# Patient Record
Sex: Female | Born: 1955 | Race: White | Hispanic: No | Marital: Single | Smoking: Former smoker
Health system: Southern US, Community
[De-identification: ages and names within clinical notes are randomized; demographics above are authoritative.]

## PROBLEM LIST (undated history)

## (undated) DIAGNOSIS — K861 Other chronic pancreatitis: Secondary | ICD-10-CM

## (undated) DIAGNOSIS — I10 Essential (primary) hypertension: Secondary | ICD-10-CM

## (undated) DIAGNOSIS — J449 Chronic obstructive pulmonary disease, unspecified: Secondary | ICD-10-CM

## (undated) DIAGNOSIS — I219 Acute myocardial infarction, unspecified: Secondary | ICD-10-CM

## (undated) DIAGNOSIS — I739 Peripheral vascular disease, unspecified: Secondary | ICD-10-CM

## (undated) DIAGNOSIS — I2510 Atherosclerotic heart disease of native coronary artery without angina pectoris: Secondary | ICD-10-CM

## (undated) HISTORY — PX: AMPUTATION TOE: SHX6595

---

## 2017-04-04 DIAGNOSIS — I739 Peripheral vascular disease, unspecified: Secondary | ICD-10-CM | POA: Diagnosis present

## 2020-03-16 DIAGNOSIS — I10 Essential (primary) hypertension: Secondary | ICD-10-CM | POA: Diagnosis present

## 2020-03-16 DIAGNOSIS — Z89422 Acquired absence of other left toe(s): Secondary | ICD-10-CM

## 2020-03-17 DIAGNOSIS — G894 Chronic pain syndrome: Secondary | ICD-10-CM | POA: Diagnosis present

## 2020-08-31 ENCOUNTER — Emergency Department: Payer: Medicare Other

## 2020-08-31 ENCOUNTER — Inpatient Hospital Stay
Admission: EM | Admit: 2020-08-31 | Discharge: 2020-09-03 | DRG: 871 | Disposition: A | Discharge status: Home / Self Care | Payer: Medicare Other | Attending: Internal Medicine | Admitting: Internal Medicine

## 2020-08-31 ENCOUNTER — Encounter: Payer: Self-pay | Admitting: Emergency Medicine

## 2020-08-31 ENCOUNTER — Other Ambulatory Visit: Payer: Self-pay

## 2020-08-31 DIAGNOSIS — D539 Nutritional anemia, unspecified: Secondary | ICD-10-CM | POA: Diagnosis present

## 2020-08-31 DIAGNOSIS — A4189 Other specified sepsis: Principal | ICD-10-CM | POA: Diagnosis present

## 2020-08-31 DIAGNOSIS — I252 Old myocardial infarction: Secondary | ICD-10-CM

## 2020-08-31 DIAGNOSIS — I10 Essential (primary) hypertension: Secondary | ICD-10-CM | POA: Diagnosis present

## 2020-08-31 DIAGNOSIS — J9600 Acute respiratory failure, unspecified whether with hypoxia or hypercapnia: Secondary | ICD-10-CM | POA: Diagnosis present

## 2020-08-31 DIAGNOSIS — E785 Hyperlipidemia, unspecified: Secondary | ICD-10-CM | POA: Diagnosis present

## 2020-08-31 DIAGNOSIS — G894 Chronic pain syndrome: Secondary | ICD-10-CM | POA: Diagnosis present

## 2020-08-31 DIAGNOSIS — Z8249 Family history of ischemic heart disease and other diseases of the circulatory system: Secondary | ICD-10-CM

## 2020-08-31 DIAGNOSIS — Z89432 Acquired absence of left foot: Secondary | ICD-10-CM

## 2020-08-31 DIAGNOSIS — R778 Other specified abnormalities of plasma proteins: Secondary | ICD-10-CM | POA: Diagnosis present

## 2020-08-31 DIAGNOSIS — I429 Cardiomyopathy, unspecified: Secondary | ICD-10-CM

## 2020-08-31 DIAGNOSIS — I110 Hypertensive heart disease with heart failure: Secondary | ICD-10-CM | POA: Diagnosis present

## 2020-08-31 DIAGNOSIS — I214 Non-ST elevation (NSTEMI) myocardial infarction: Secondary | ICD-10-CM

## 2020-08-31 DIAGNOSIS — Z79899 Other long term (current) drug therapy: Secondary | ICD-10-CM

## 2020-08-31 DIAGNOSIS — I739 Peripheral vascular disease, unspecified: Secondary | ICD-10-CM | POA: Diagnosis present

## 2020-08-31 DIAGNOSIS — J9601 Acute respiratory failure with hypoxia: Secondary | ICD-10-CM | POA: Diagnosis not present

## 2020-08-31 DIAGNOSIS — E876 Hypokalemia: Secondary | ICD-10-CM | POA: Diagnosis present

## 2020-08-31 DIAGNOSIS — I5031 Acute diastolic (congestive) heart failure: Secondary | ICD-10-CM | POA: Diagnosis present

## 2020-08-31 DIAGNOSIS — J159 Unspecified bacterial pneumonia: Secondary | ICD-10-CM | POA: Diagnosis present

## 2020-08-31 DIAGNOSIS — Z9861 Coronary angioplasty status: Secondary | ICD-10-CM

## 2020-08-31 DIAGNOSIS — Z832 Family history of diseases of the blood and blood-forming organs and certain disorders involving the immune mechanism: Secondary | ICD-10-CM

## 2020-08-31 DIAGNOSIS — Z89422 Acquired absence of other left toe(s): Secondary | ICD-10-CM

## 2020-08-31 DIAGNOSIS — J440 Chronic obstructive pulmonary disease with acute lower respiratory infection: Secondary | ICD-10-CM | POA: Diagnosis present

## 2020-08-31 DIAGNOSIS — J1282 Pneumonia due to coronavirus disease 2019: Secondary | ICD-10-CM | POA: Diagnosis present

## 2020-08-31 DIAGNOSIS — J441 Chronic obstructive pulmonary disease with (acute) exacerbation: Secondary | ICD-10-CM | POA: Diagnosis present

## 2020-08-31 DIAGNOSIS — K861 Other chronic pancreatitis: Secondary | ICD-10-CM | POA: Diagnosis present

## 2020-08-31 DIAGNOSIS — Z87891 Personal history of nicotine dependence: Secondary | ICD-10-CM

## 2020-08-31 DIAGNOSIS — I2510 Atherosclerotic heart disease of native coronary artery without angina pectoris: Secondary | ICD-10-CM | POA: Diagnosis present

## 2020-08-31 DIAGNOSIS — I255 Ischemic cardiomyopathy: Secondary | ICD-10-CM | POA: Diagnosis present

## 2020-08-31 DIAGNOSIS — U071 COVID-19: Secondary | ICD-10-CM | POA: Diagnosis present

## 2020-08-31 DIAGNOSIS — Z823 Family history of stroke: Secondary | ICD-10-CM

## 2020-08-31 HISTORY — DX: Other chronic pancreatitis: K86.1

## 2020-08-31 HISTORY — DX: Acute myocardial infarction, unspecified: I21.9

## 2020-08-31 HISTORY — DX: Atherosclerotic heart disease of native coronary artery without angina pectoris: I25.10

## 2020-08-31 HISTORY — DX: Chronic obstructive pulmonary disease, unspecified: J44.9

## 2020-08-31 HISTORY — DX: Peripheral vascular disease, unspecified: I73.9

## 2020-08-31 HISTORY — DX: Essential (primary) hypertension: I10

## 2020-08-31 LAB — COMPREHENSIVE METABOLIC PANEL
ALT: 15 U/L (ref 0–44)
AST: 21 U/L (ref 15–41)
Albumin: 3.4 g/dL — ABNORMAL LOW (ref 3.5–5.0)
Alkaline Phosphatase: 101 U/L (ref 38–126)
Anion gap: 13 (ref 5–15)
BUN: 10 mg/dL (ref 8–23)
CO2: 23 mmol/L (ref 22–32)
Calcium: 8.5 mg/dL — ABNORMAL LOW (ref 8.9–10.3)
Chloride: 102 mmol/L (ref 98–111)
Creatinine, Ser: 0.91 mg/dL (ref 0.44–1.00)
GFR calc Af Amer: >60 mL/min (ref >60)
GFR calc non Af Amer: >60 mL/min (ref >60)
Glucose, Bld: 135 mg/dL — ABNORMAL HIGH (ref 70–99)
Potassium: 3.3 mmol/L — ABNORMAL LOW (ref 3.5–5.1)
Sodium: 138 mmol/L (ref 135–145)
Total Bilirubin: 0.6 mg/dL (ref 0.3–1.2)
Total Protein: 7.8 g/dL (ref 6.5–8.1)

## 2020-08-31 LAB — CBC WITH DIFFERENTIAL/PLATELET
Abs Immature Granulocytes: 0.08 10*3/uL — ABNORMAL HIGH (ref 0.00–0.07)
Basophils Absolute: 0 10*3/uL (ref 0.0–0.1)
Basophils Relative: 0 %
Eosinophils Absolute: 0.1 10*3/uL (ref 0.0–0.5)
Eosinophils Relative: 0 %
HCT: 28.6 % — ABNORMAL LOW (ref 36.0–46.0)
Hemoglobin: 9.9 g/dL — ABNORMAL LOW (ref 12.0–15.0)
Immature Granulocytes: 1 %
Lymphocytes Relative: 8 %
Lymphs Abs: 1 10*3/uL (ref 0.7–4.0)
MCH: 39.8 pg — ABNORMAL HIGH (ref 26.0–34.0)
MCHC: 34.6 g/dL (ref 30.0–36.0)
MCV: 114.9 fL — ABNORMAL HIGH (ref 80.0–100.0)
Monocytes Absolute: 0.4 10*3/uL (ref 0.1–1.0)
Monocytes Relative: 3 %
Neutro Abs: 10.2 10*3/uL — ABNORMAL HIGH (ref 1.7–7.7)
Neutrophils Relative %: 88 %
Platelets: 146 10*3/uL — ABNORMAL LOW (ref 150–400)
RBC: 2.49 MIL/uL — ABNORMAL LOW (ref 3.87–5.11)
RDW: 13.9 % (ref 11.5–15.5)
WBC: 11.7 10*3/uL — ABNORMAL HIGH (ref 4.0–10.5)
nRBC: 0 % (ref 0.0–0.2)

## 2020-08-31 LAB — URINALYSIS, COMPLETE (UACMP) WITH MICROSCOPIC
Bilirubin Urine: NEGATIVE
Glucose, UA: NEGATIVE mg/dL
Hgb urine dipstick: NEGATIVE
Ketones, ur: NEGATIVE mg/dL
Leukocytes,Ua: NEGATIVE
Nitrite: NEGATIVE
Protein, ur: NEGATIVE mg/dL
Specific Gravity, Urine: 1.005 (ref 1.005–1.030)
pH: 7 (ref 5.0–8.0)

## 2020-08-31 LAB — TROPONIN I (HIGH SENSITIVITY)
Troponin I (High Sensitivity): 500 ng/L (ref <18)
Troponin I (High Sensitivity): 1063 ng/L (ref <18)

## 2020-08-31 LAB — BRAIN NATRIURETIC PEPTIDE: B Natriuretic Peptide: 1778 pg/mL — ABNORMAL HIGH (ref 0.0–100.0)

## 2020-08-31 LAB — TRIGLYCERIDES: Triglycerides: 152 mg/dL — ABNORMAL HIGH (ref <150)

## 2020-08-31 LAB — LACTIC ACID, PLASMA
Lactic Acid, Venous: 1.5 mmol/L (ref 0.5–1.9)
Lactic Acid, Venous: 1 mmol/L (ref 0.5–1.9)

## 2020-08-31 LAB — PROCALCITONIN: Procalcitonin: 0.93 ng/mL

## 2020-08-31 LAB — LACTATE DEHYDROGENASE: LDH: 200 U/L — ABNORMAL HIGH (ref 98–192)

## 2020-08-31 LAB — FIBRIN DERIVATIVES D-DIMER (ARMC ONLY): Fibrin derivatives D-dimer (ARMC): 3027.51 ng/mL (FEU) — ABNORMAL HIGH (ref 0.00–499.00)

## 2020-08-31 LAB — FERRITIN: Ferritin: 121 ng/mL (ref 11–307)

## 2020-08-31 LAB — FIBRINOGEN: Fibrinogen: >750 mg/dL — ABNORMAL HIGH (ref 210–475)

## 2020-08-31 MED ORDER — METHYLPREDNISOLONE SODIUM SUCC 125 MG IJ SOLR
125.0000 mg | Freq: Once | INTRAMUSCULAR | Status: AC
  Administered 2020-08-31: 125 mg via INTRAVENOUS
  Filled 2020-08-31: qty 2

## 2020-08-31 MED ORDER — ACETAMINOPHEN 500 MG PO TABS
1000.0000 mg | ORAL_TABLET | Freq: Once | ORAL | Status: AC
  Administered 2020-08-31: 1000 mg via ORAL
  Filled 2020-08-31: qty 2

## 2020-08-31 MED ORDER — SODIUM CHLORIDE 0.9 % IV SOLN
200.0000 mg | Freq: Once | INTRAVENOUS | Status: AC
  Administered 2020-09-01: 200 mg via INTRAVENOUS
  Filled 2020-08-31: qty 200

## 2020-08-31 MED ORDER — ONDANSETRON HCL 4 MG/2ML IJ SOLN
4.0000 mg | Freq: Once | INTRAMUSCULAR | Status: AC
  Administered 2020-08-31: 4 mg via INTRAVENOUS
  Filled 2020-08-31: qty 2

## 2020-08-31 MED ORDER — SODIUM CHLORIDE 0.9 % IV SOLN
100.0000 mg | Freq: Every day | INTRAVENOUS | Status: DC
  Administered 2020-09-01: 100 mg via INTRAVENOUS
  Filled 2020-08-31 (×3): qty 20

## 2020-08-31 MED ORDER — ASPIRIN 81 MG PO CHEW
324.0000 mg | CHEWABLE_TABLET | Freq: Once | ORAL | Status: AC
  Administered 2020-08-31: 324 mg via ORAL
  Filled 2020-08-31: qty 4

## 2020-08-31 MED ORDER — MORPHINE SULFATE (PF) 4 MG/ML IV SOLN
4.0000 mg | Freq: Once | INTRAVENOUS | Status: AC
  Administered 2020-08-31: 4 mg via INTRAVENOUS
  Filled 2020-08-31: qty 1

## 2020-08-31 NOTE — ED Provider Notes (Signed)
[facility] Emergency Department Provider Note ____________________________________________   First MD Initiated Contact with Patient 08/31/20 2014     (approximate)  I have reviewed the triage vital signs and the nursing notes.   HISTORY  Chief Complaint Shortness of Breath    HPI Charlotte Castaneda is a 64 y.o. female with PMH as noted below as well as a history of COPD who presents with worsening shortness of breath over the last 2 to 3 days, gradual onset, associated with nonproductive cough, fever, and generalized weakness.  She also has substernal chest pain.  The patient states that she was diagnosed with COVID-19 on 9/5.  She received monoclonal antibody treatment several days ago, but states that she has been feeling worse over these last few days.  Past Medical History:  Diagnosis Date  . MI (myocardial infarction) (HCC)     There are no problems to display for this patient.   Past Surgical History:  Procedure Laterality Date  . AMPUTATION TOE      Prior to Admission medications   Not on File    Allergies Patient has no allergy information on record.  History reviewed. No pertinent family history.  Social History Social History   Tobacco Use  . Smoking status: Former Games developer  . Smokeless tobacco: Never Used  Substance Use Topics  . Alcohol use: Never  . Drug use: Never    Review of Systems  Constitutional: Positive for fever. Eyes: No redness. ENT: No sore throat. Cardiovascular: Positive for chest pain. Respiratory: Positive for shortness of breath. Gastrointestinal: No vomiting or diarrhea.  Genitourinary: Negative for flank pain.  Musculoskeletal: Negative for back pain. Skin: Negative for rash. Neurological: Negative for headache.   ____________________________________________   PHYSICAL EXAM:  VITAL SIGNS: ED Triage Vitals  Enc Vitals Group     BP --      Pulse Rate 08/31/20 2017 (!) 113     Resp 08/31/20  2017 (!) 22     Temp 08/31/20 2017 (!) 102.8 F (39.3 C)     Temp Source 08/31/20 2017 Oral     SpO2 08/31/20 2017 98 %     Weight 08/31/20 2018 85 lb (38.6 kg)     Height 08/31/20 2018 4\' 10"  (1.473 m)     Head Circumference --      Peak Flow --      Pain Score 08/31/20 2018 10     Pain Loc --      Pain Edu? --      Excl. in GC? --     Constitutional: Alert and oriented. Well appearing and in no acute distress. Eyes: Conjunctivae are normal.  Head: Atraumatic. Nose: No congestion/rhinnorhea. Mouth/Throat: Mucous membranes are moist.   Neck: Normal range of motion.  Cardiovascular: Normal rate, regular rhythm. Grossly normal heart sounds.  Good peripheral circulation. Respiratory: Normal respiratory effort.  No retractions. Lungs CTAB. Gastrointestinal: Soft and nontender. No distention.  Genitourinary: No flank tenderness. Musculoskeletal: No lower extremity edema.  Extremities warm and well perfused.  Neurologic:  Normal speech and language. No gross focal neurologic deficits are appreciated.  Skin:  Skin is warm and dry. No rash noted. Psychiatric: Mood and affect are normal. Speech and behavior are normal.  ____________________________________________   LABS (all labs ordered are listed, but only abnormal results are displayed)  Labs Reviewed  CBC WITH DIFFERENTIAL/PLATELET - Abnormal; Notable for the following components:      Result Value   WBC 11.7 (*)  RBC 2.49 (*)    Hemoglobin 9.9 (*)    HCT 28.6 (*)    MCV 114.9 (*)    MCH 39.8 (*)    Platelets 146 (*)    Neutro Abs 10.2 (*)    Abs Immature Granulocytes 0.08 (*)    All other components within normal limits  COMPREHENSIVE METABOLIC PANEL - Abnormal; Notable for the following components:   Potassium 3.3 (*)    Glucose, Bld 135 (*)    Calcium 8.5 (*)    Albumin 3.4 (*)    All other components within normal limits  FIBRIN DERIVATIVES D-DIMER ([facility] ONLY) - Abnormal; Notable for the following components:    Fibrin derivatives D-dimer ([facility]) 3,027.51 (*)    All other components within normal limits  LACTATE DEHYDROGENASE - Abnormal; Notable for the following components:   LDH 200 (*)    All other components within normal limits  TRIGLYCERIDES - Abnormal; Notable for the following components:   Triglycerides 152 (*)    All other components within normal limits  FIBRINOGEN - Abnormal; Notable for the following components:   Fibrinogen >750 (*)    All other components within normal limits  BRAIN NATRIURETIC PEPTIDE - Abnormal; Notable for the following components:   B Natriuretic Peptide 1,778.0 (*)    All other components within normal limits  URINALYSIS, COMPLETE (UACMP) WITH MICROSCOPIC - Abnormal; Notable for the following components:   Color, Urine STRAW (*)    APPearance CLEAR (*)    Bacteria, UA RARE (*)    All other components within normal limits  TROPONIN I (HIGH SENSITIVITY) - Abnormal; Notable for the following components:   Troponin I (High Sensitivity) 500 (*)    All other components within normal limits  CULTURE, BLOOD (ROUTINE X 2)  CULTURE, BLOOD (ROUTINE X 2)  PROCALCITONIN  FERRITIN  LACTIC ACID, PLASMA  C-REACTIVE PROTEIN  LACTIC ACID, PLASMA  TROPONIN I (HIGH SENSITIVITY)   ____________________________________________  EKG  ED ECG REPORT I, Dionne Bucy, the attending physician, personally viewed and interpreted this ECG.  Date: 08/31/2020 EKG Time: 2053 Rate: 99 Rhythm: normal sinus rhythm QRS Axis: normal Intervals: normal ST/T Wave abnormalities: Nonspecific abnormalities Narrative Interpretation: Nonspecific abnormalities with no evidence of acute ischemia  ____________________________________________  RADIOLOGY  CXR: Multifocal interstitial opacities  ____________________________________________   PROCEDURES  Procedure(s) performed: No  Procedures  Critical Care performed: Yes  CRITICAL CARE Performed by: Dionne Bucy   Total critical care time: 40 minutes  Critical care time was exclusive of separately billable procedures and treating other patients.  Critical care was necessary to treat or prevent imminent or life-threatening deterioration.  Critical care was time spent personally by me on the following activities: development of treatment plan with patient and/or surrogate as well as nursing, discussions with consultants, evaluation of patient's response to treatment, examination of patient, obtaining history from patient or surrogate, ordering and performing treatments and interventions, ordering and review of laboratory studies, ordering and review of radiographic studies, pulse oximetry and re-evaluation of patient's condition. ____________________________________________   INITIAL IMPRESSION / ASSESSMENT AND PLAN / ED COURSE  Pertinent labs & imaging results that were available during my care of the patient were reviewed by me and considered in my medical decision making (see chart for details).  64 year old female with a history of COPD presents with worsening shortness of breath, fever, and generalized weakness over the last several days after being diagnosed with COVID-19 on 9/5.  She apparently received a monoclonal antibody treatment  last week.  I attempted to review the past medical records in Epic, however no prior records are available in Epic or Care Everywhere.  The patient reports that she is vaccinated for COVID-19.  On exam, the patient is febrile, tachycardic, tachypneic.  Per EMS, O2 saturation on room air was in the low to mid 80s, and she went up to the high 90s on 6 L by nasal cannula.  She is currently in the mid 90s on 2 L.  She has increased work of breathing but is able to speak in full sentences.  Lung sounds are diminished bilaterally with no significant wheezing or rales.  Neurologic exam is nonfocal.  The rest of the physical exam is unremarkable.  Overall  presentation is most consistent with respiratory failure due to COVID-19, versus possible superimposed pneumonia.  Although the patient is having chest pain, I suspect that this is mostly musculoskeletal and related to her respiratory distress and cough.  I have a low suspicion for ACS, her EKG is nonischemic.  There is no specific clinical evidence to suggest PE.  We will obtain chest x-ray, lab work-up, give analgesia and antipyretic, and reassess.  I anticipate admission given the oxygen requirement.  ----------------------------------------- 11:06 PM on 08/31/2020 -----------------------------------------  Chest x-ray and lab work-up are consistent with active COVID-19.  I have ordered Solu-Medrol and remdesivir.  The troponin is also elevated although there are no focal ST abnormalities.  Presentation is consistent with myocardial strain versus myocarditis.  I have given aspirin.  The patient is stable on 2 L of O2 by nasal cannula at this time.  I discussed the case with Dr. Mikeal Hawthorne from the hospitalist service for admission.  ______________________  Binnie Rail was evaluated in Emergency Department on 08/31/2020 for the symptoms described in the history of present illness. She was evaluated in the context of the global COVID-19 pandemic, which necessitated consideration that the patient might be at risk for infection with the SARS-CoV-2 virus that causes COVID-19. Institutional protocols and algorithms that pertain to the evaluation of patients at risk for COVID-19 are in a state of rapid change based on information released by regulatory bodies including the CDC and federal and state organizations. These policies and algorithms were followed during the patient's care in the ED.   ____________________________________________   FINAL CLINICAL IMPRESSION(S) / ED DIAGNOSES  Final diagnoses:  None      NEW MEDICATIONS STARTED DURING THIS VISIT:  New Prescriptions   No medications on  file     Note:  This document was prepared using Dragon voice recognition software and may include unintentional dictation errors.    Dionne Bucy, MD 08/31/20 [phone]

## 2020-08-31 NOTE — Consult Note (Signed)
Remdesivir - Pharmacy Brief Note    A/P:  Remdesivir 200 mg IVPB once followed by 100 mg IVPB daily x 4 days.   Cephus Shelling, PharmD Clinical Pharmacist  08/31/2020 10:24 PM

## 2020-08-31 NOTE — ED Triage Notes (Signed)
Pt arrival via [facility] from home due to shortness of breath. Patient tested positive for covid on Sept 5th and started feeling extra short of breath today. EMS arrived and found patient on 84% on RA with diminished lower lobes. PT placed on 6 L of oxygen and O2 went up to 100%. Pt has received on infusion.   Pt VS: -104.3 temp -HR 118   Pt received one bolus of fluids with EMS.

## 2020-09-01 ENCOUNTER — Encounter: Payer: Self-pay | Admitting: Internal Medicine

## 2020-09-01 ENCOUNTER — Inpatient Hospital Stay: Payer: Medicare Other

## 2020-09-01 ENCOUNTER — Inpatient Hospital Stay (HOSPITAL_COMMUNITY)
Admit: 2020-09-01 | Discharge: 2020-09-01 | Disposition: A | Discharge status: Home / Self Care | Payer: Medicare Other | Attending: Internal Medicine | Admitting: Internal Medicine

## 2020-09-01 DIAGNOSIS — I739 Peripheral vascular disease, unspecified: Secondary | ICD-10-CM | POA: Diagnosis present

## 2020-09-01 DIAGNOSIS — Z87891 Personal history of nicotine dependence: Secondary | ICD-10-CM | POA: Diagnosis not present

## 2020-09-01 DIAGNOSIS — G894 Chronic pain syndrome: Secondary | ICD-10-CM | POA: Diagnosis present

## 2020-09-01 DIAGNOSIS — E876 Hypokalemia: Secondary | ICD-10-CM | POA: Diagnosis present

## 2020-09-01 DIAGNOSIS — I5031 Acute diastolic (congestive) heart failure: Secondary | ICD-10-CM | POA: Diagnosis present

## 2020-09-01 DIAGNOSIS — E785 Hyperlipidemia, unspecified: Secondary | ICD-10-CM | POA: Diagnosis present

## 2020-09-01 DIAGNOSIS — J1282 Pneumonia due to coronavirus disease 2019: Secondary | ICD-10-CM | POA: Diagnosis present

## 2020-09-01 DIAGNOSIS — Z89422 Acquired absence of other left toe(s): Secondary | ICD-10-CM | POA: Diagnosis not present

## 2020-09-01 DIAGNOSIS — R778 Other specified abnormalities of plasma proteins: Secondary | ICD-10-CM | POA: Diagnosis present

## 2020-09-01 DIAGNOSIS — I110 Hypertensive heart disease with heart failure: Secondary | ICD-10-CM | POA: Diagnosis present

## 2020-09-01 DIAGNOSIS — I420 Dilated cardiomyopathy: Secondary | ICD-10-CM | POA: Diagnosis not present

## 2020-09-01 DIAGNOSIS — J9600 Acute respiratory failure, unspecified whether with hypoxia or hypercapnia: Secondary | ICD-10-CM | POA: Diagnosis present

## 2020-09-01 DIAGNOSIS — U071 COVID-19: Secondary | ICD-10-CM | POA: Diagnosis present

## 2020-09-01 DIAGNOSIS — I5020 Unspecified systolic (congestive) heart failure: Secondary | ICD-10-CM | POA: Diagnosis not present

## 2020-09-01 DIAGNOSIS — A4189 Other specified sepsis: Secondary | ICD-10-CM | POA: Diagnosis present

## 2020-09-01 DIAGNOSIS — Z89432 Acquired absence of left foot: Secondary | ICD-10-CM | POA: Diagnosis not present

## 2020-09-01 DIAGNOSIS — Z832 Family history of diseases of the blood and blood-forming organs and certain disorders involving the immune mechanism: Secondary | ICD-10-CM | POA: Diagnosis not present

## 2020-09-01 DIAGNOSIS — I25118 Atherosclerotic heart disease of native coronary artery with other forms of angina pectoris: Secondary | ICD-10-CM | POA: Diagnosis not present

## 2020-09-01 DIAGNOSIS — J441 Chronic obstructive pulmonary disease with (acute) exacerbation: Secondary | ICD-10-CM | POA: Diagnosis present

## 2020-09-01 DIAGNOSIS — I255 Ischemic cardiomyopathy: Secondary | ICD-10-CM | POA: Diagnosis present

## 2020-09-01 DIAGNOSIS — D539 Nutritional anemia, unspecified: Secondary | ICD-10-CM | POA: Diagnosis present

## 2020-09-01 DIAGNOSIS — J9601 Acute respiratory failure with hypoxia: Secondary | ICD-10-CM | POA: Diagnosis present

## 2020-09-01 DIAGNOSIS — Z8249 Family history of ischemic heart disease and other diseases of the circulatory system: Secondary | ICD-10-CM | POA: Diagnosis not present

## 2020-09-01 DIAGNOSIS — I214 Non-ST elevation (NSTEMI) myocardial infarction: Secondary | ICD-10-CM | POA: Diagnosis not present

## 2020-09-01 DIAGNOSIS — J440 Chronic obstructive pulmonary disease with acute lower respiratory infection: Secondary | ICD-10-CM | POA: Diagnosis present

## 2020-09-01 DIAGNOSIS — J159 Unspecified bacterial pneumonia: Secondary | ICD-10-CM | POA: Diagnosis present

## 2020-09-01 DIAGNOSIS — K861 Other chronic pancreatitis: Secondary | ICD-10-CM | POA: Diagnosis present

## 2020-09-01 DIAGNOSIS — Z823 Family history of stroke: Secondary | ICD-10-CM | POA: Diagnosis not present

## 2020-09-01 DIAGNOSIS — I2510 Atherosclerotic heart disease of native coronary artery without angina pectoris: Secondary | ICD-10-CM | POA: Diagnosis present

## 2020-09-01 DIAGNOSIS — I252 Old myocardial infarction: Secondary | ICD-10-CM | POA: Diagnosis not present

## 2020-09-01 LAB — TROPONIN I (HIGH SENSITIVITY)
Troponin I (High Sensitivity): 2163 ng/L (ref <18)
Troponin I (High Sensitivity): 1915 ng/L (ref <18)

## 2020-09-01 LAB — ECHOCARDIOGRAM COMPLETE
AR max vel: 1.41 cm2
AV Area VTI: 1.38 cm2
AV Area mean vel: 1.54 cm2
AV Mean grad: 3 mmHg
AV Peak grad: 5.7 mmHg
Ao pk vel: 1.19 m/s
Area-P 1/2: 5.79 cm2
Calc EF: 50.4 %
Height: 58 in
S' Lateral: 3.76 cm
Single Plane A2C EF: 47.4 %
Single Plane A4C EF: 53 %
Weight: 1360 oz

## 2020-09-01 LAB — C-REACTIVE PROTEIN: CRP: 14.5 mg/dL — ABNORMAL HIGH (ref <1.0)

## 2020-09-01 LAB — HIV ANTIBODY (ROUTINE TESTING W REFLEX): HIV Screen 4th Generation wRfx: NONREACTIVE

## 2020-09-01 LAB — APTT: aPTT: 58 seconds — ABNORMAL HIGH (ref 24–36)

## 2020-09-01 LAB — SARS CORONAVIRUS 2 BY RT PCR (HOSPITAL ORDER, PERFORMED IN ~~LOC~~ HOSPITAL LAB): SARS Coronavirus 2: NEGATIVE

## 2020-09-01 LAB — PROTIME-INR
INR: 1 (ref 0.8–1.2)
Prothrombin Time: 12.3 seconds (ref 11.4–15.2)

## 2020-09-01 LAB — HEPARIN LEVEL (UNFRACTIONATED): Heparin Unfractionated: <0.1 IU/mL — ABNORMAL LOW (ref 0.30–0.70)

## 2020-09-01 IMAGING — CT CT ANGIO CHEST
2 of 6 series · 17 of 46 positions shown · IV contrast (APPLIED)
Comparison: Chest radiograph [DATE]

CLINICAL DATA: Respiratory failure.  Reported [1X] positive

EXAM:
CT ANGIOGRAPHY CHEST WITH CONTRAST
TECHNIQUE: Multidetector CT imaging of the chest was performed using the
standard protocol during bolus administration of intravenous
contrast. Multiplanar CT image reconstructions and MIPs were
obtained to evaluate the vascular anatomy.
CONTRAST:  100mL OMNIPAQUE IOHEXOL 350 MG/ML SOLN

[Series 5: thins · axial · 0.60mm/px · z∈[-661,-434]mm · 15 of 249 slices shown]
[im 11/249  lung]
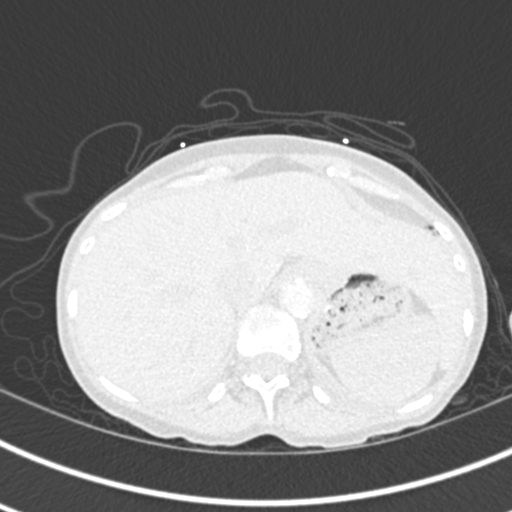
[im 33/249  soft-tissue]
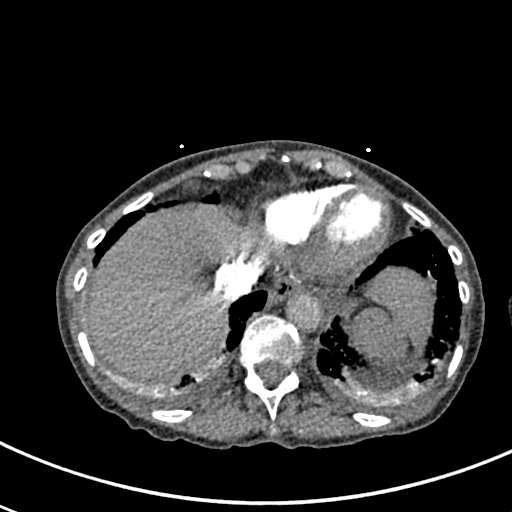
[im 44/249  lung]
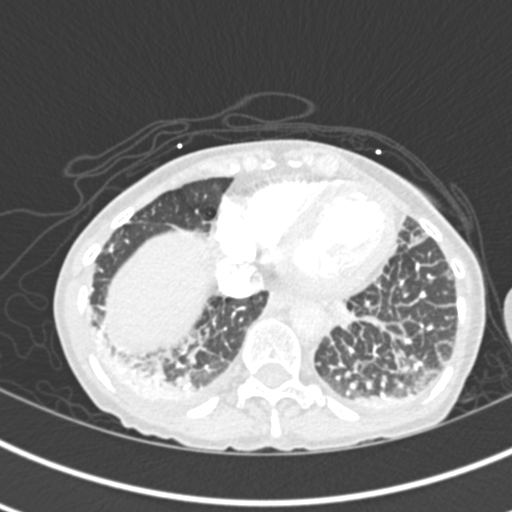
[im 65/249  soft-tissue]
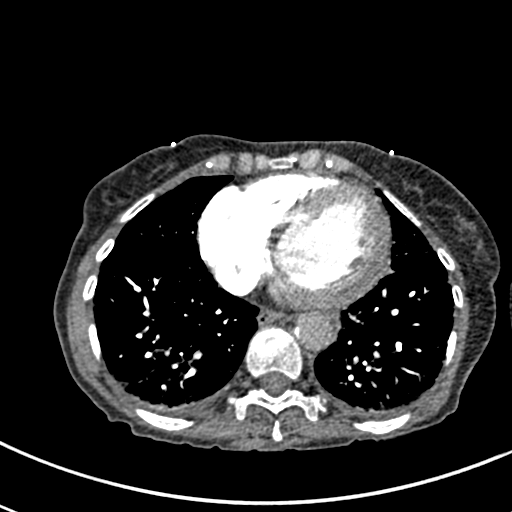
[im 76/249  lung]
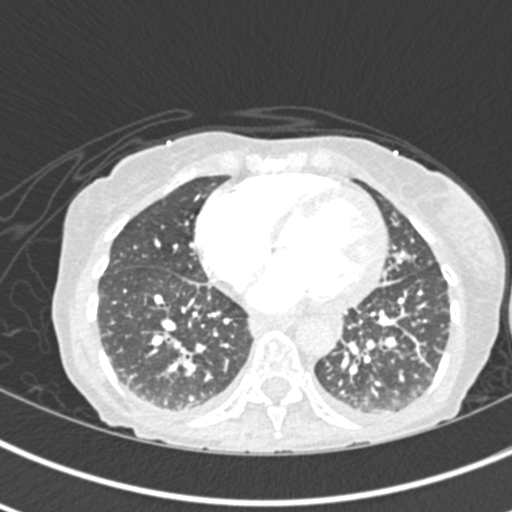
[im 98/249  soft-tissue]
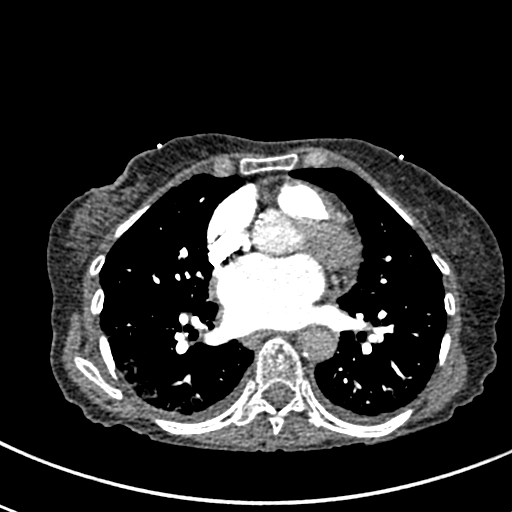
[im 108/249  lung]
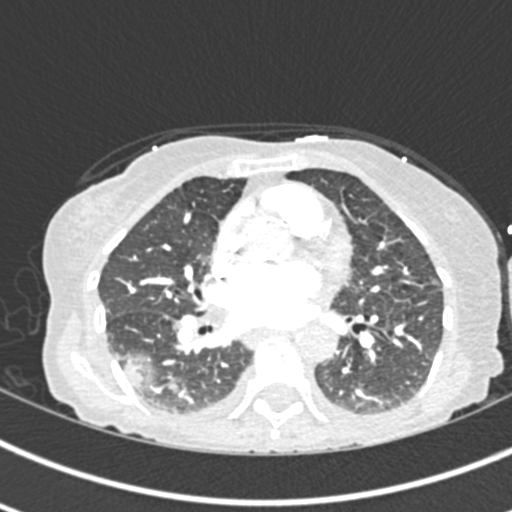
[im 130/249  soft-tissue]
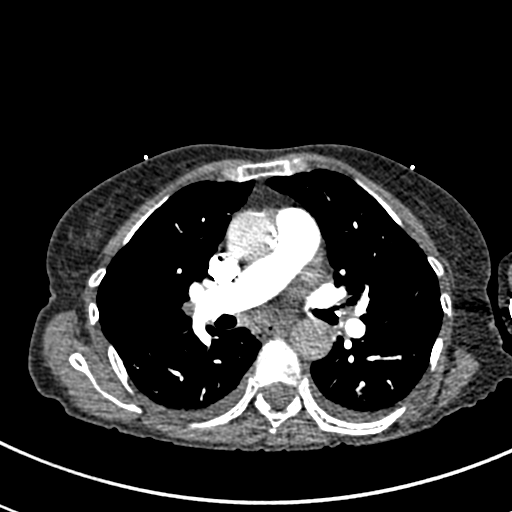
[im 141/249  lung]
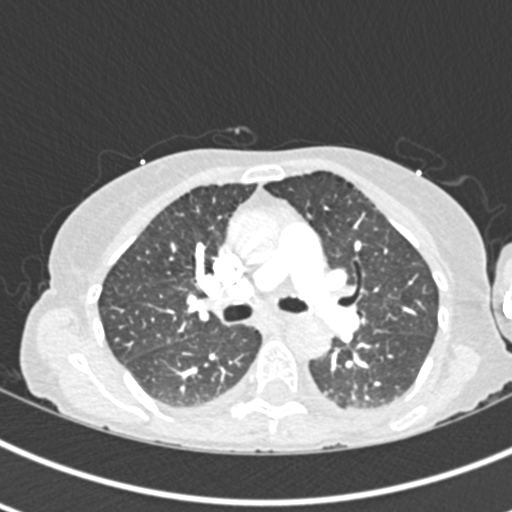
[im 151/249  soft-tissue]
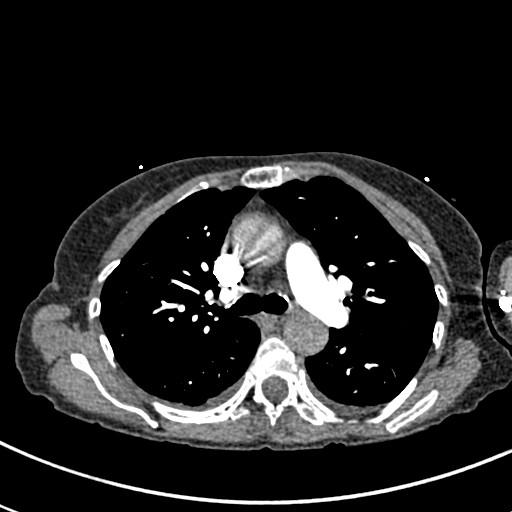
[im 173/249  lung]
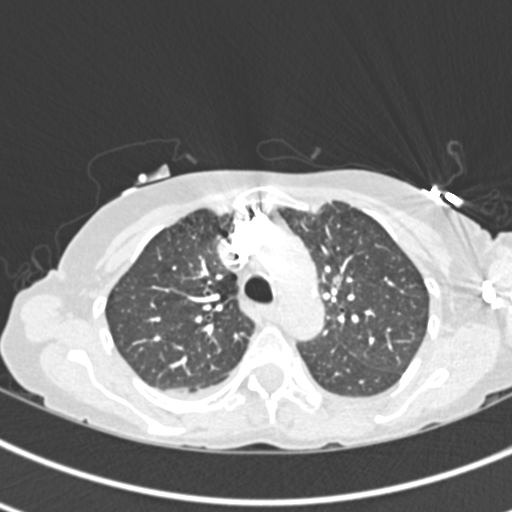
[im 184/249  soft-tissue]
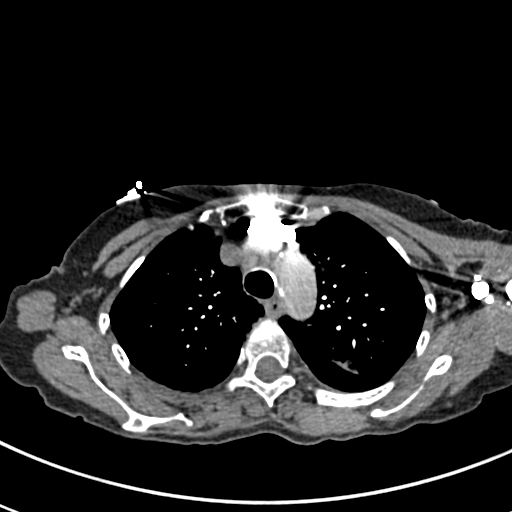
[im 205/249  lung]
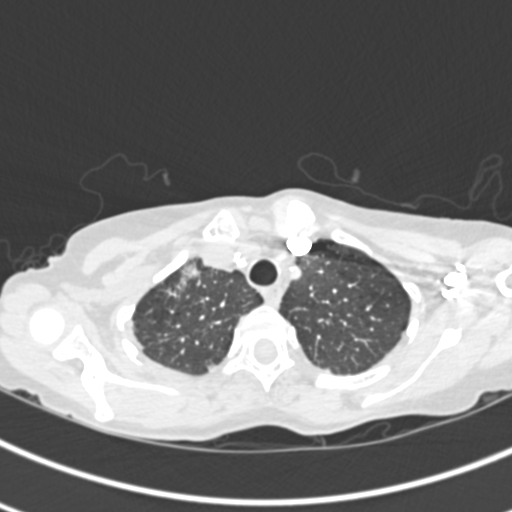
[im 216/249  soft-tissue]
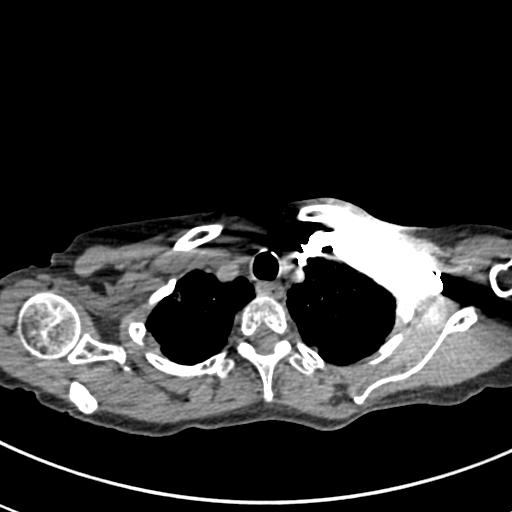
[im 238/249  lung]
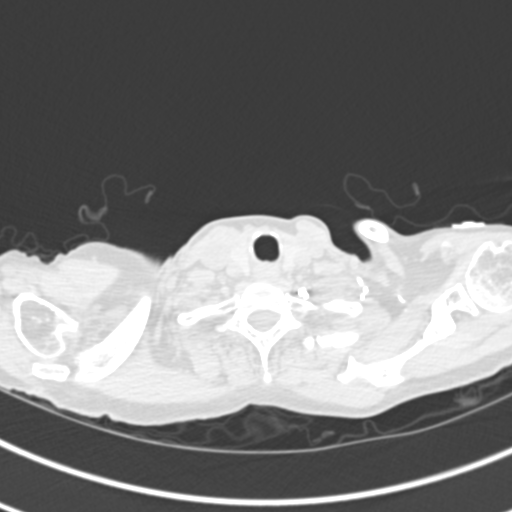

[Series 7: coronal mpr · coronal · 0.49mm/px · 2 of 63 slices shown]
[im 21/63  soft-tissue]
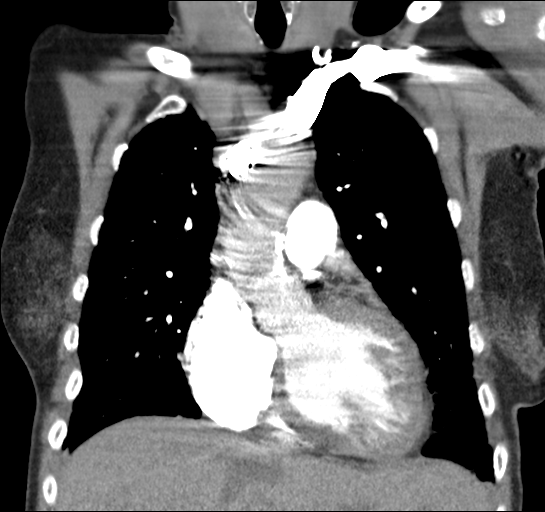
[im 42/63  soft-tissue]
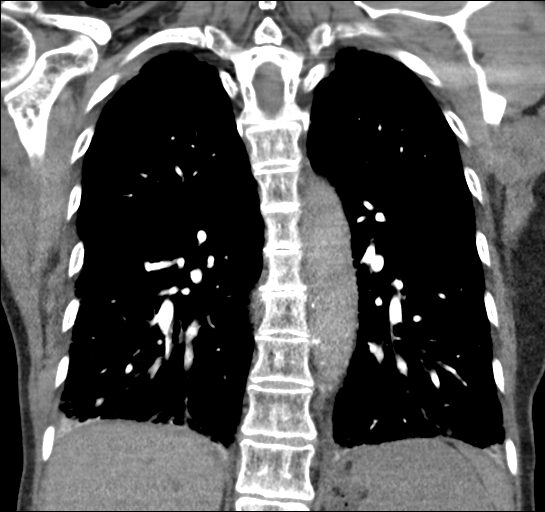

[17 of 46 positions shown; findings below may reference images not displayed]

FINDINGS: Cardiovascular: There is no demonstrable pulmonary embolus. There is
no appreciable thoracic aortic aneurysm. No dissection evident. Note
that the contrast bolus in the aorta is less than optimal for
dissection assessment. There are multiple foci of great vessel
calcification. There is aortic atherosclerosis. There is multifocal
coronary artery calcification. There is no pericardial effusion or
pericardial thickening.

Mediastinum/Nodes: Visualized thyroid appears unremarkable. There is
no appreciable thoracic adenopathy. There are no esophageal lesions.

Lungs/Pleura: There are small pleural effusions bilaterally. There
is compressive atelectasis in each lung base. There is airspace
opacity in the right lower lobe and to a lesser extent scattered in
each upper lobe. Mild consolidation is noted in the lateral segment
right lower lobe in area of infiltrate. There is diffuse lobular
septal thickening throughout the lungs. On axial slice 47 series 6,
there is a nodular opacity in the posterior segment of the left
upper lobe measuring 7 x 4 mm. On axial slice 42 series 6, there is
a 2 mm nodular opacity in the posterior segment of the left upper
lobe.

Upper Abdomen: There is upper abdominal aortic atherosclerosis.
Visualized upper abdominal structures otherwise appear normal.

Musculoskeletal: No blastic or lytic bone lesions. No evident chest
wall lesions.

Review of the MIP images confirms the above findings.
IMPRESSION: 1. No appreciable pulmonary embolus. No thoracic aortic aneurysm.
Foci of aortic atherosclerosis as well as foci of great vessel and
coronary artery calcification noted.

2. Small pleural effusions bilaterally. Apparent compressive
atelectasis in the lower lobes. Focal airspace opacity in the
lateral aspect of the right middle lobe with consolidation in this
area. Patchy airspace opacity is noted in each upper lobe as well.
Suspect atypical organism pneumonia. A degree of bacterial
superinfection in the right lower lobe is questioned.

3. Nodular opacities, largest measuring 7 x 4 mm in the posterior
segment left upper lobe. Non-contrast chest CT at 6-12 months is
recommended. If the nodule is stable at time of repeat CT, then
future CT at 18-24 months (from today's scan) is considered optional
for low-risk patients, but is recommended for high-risk patients.
This recommendation follows the consensus statement: Guidelines for
Management of Incidental Pulmonary Nodules Detected on CT Images:

4. Lobular septal thickening bilaterally which may represent a
degree of interstitial edema. Atypical interstitial pneumonitis
bilaterally could present in this manner. Lymphangitic spread of
tumor from an imaging standpoint is a differential consideration.

5.  No evident adenopathy.

Aortic Atherosclerosis ([1X]-[1X]).

## 2020-09-01 MED ORDER — ACETAMINOPHEN 325 MG PO TABS
650.0000 mg | ORAL_TABLET | Freq: Four times a day (QID) | ORAL | Status: DC | PRN
  Administered 2020-09-03: 650 mg via ORAL
  Filled 2020-09-01: qty 2

## 2020-09-01 MED ORDER — PREDNISONE 50 MG PO TABS: 50.0000 mg | ORAL_TABLET | Freq: Every day | ORAL | Status: DC

## 2020-09-01 MED ORDER — ENOXAPARIN SODIUM 30 MG/0.3ML ~~LOC~~ SOLN: 30.0000 mg | SUBCUTANEOUS | Status: DC

## 2020-09-01 MED ORDER — ASCORBIC ACID 500 MG PO TABS
500.0000 mg | ORAL_TABLET | Freq: Every day | ORAL | Status: DC
  Administered 2020-09-01 – 2020-09-03 (×3): 500 mg via ORAL
  Filled 2020-09-01 (×3): qty 1

## 2020-09-01 MED ORDER — IPRATROPIUM-ALBUTEROL 20-100 MCG/ACT IN AERS
1.0000 | INHALATION_SPRAY | Freq: Four times a day (QID) | RESPIRATORY_TRACT | Status: DC
  Administered 2020-09-01 – 2020-09-03 (×8): 1 via RESPIRATORY_TRACT
  Filled 2020-09-01: qty 4

## 2020-09-01 MED ORDER — HEPARIN (PORCINE) 25000 UT/250ML-% IV SOLN
700.0000 [IU]/h | INTRAVENOUS | Status: DC
  Administered 2020-09-01: 450 [IU]/h via INTRAVENOUS
  Filled 2020-09-01: qty 250

## 2020-09-01 MED ORDER — IOHEXOL 350 MG/ML SOLN
100.0000 mL | Freq: Once | INTRAVENOUS | Status: AC | PRN
  Administered 2020-09-01: 100 mL via INTRAVENOUS

## 2020-09-01 MED ORDER — ATORVASTATIN CALCIUM 20 MG PO TABS: 40.0000 mg | ORAL_TABLET | Freq: Every day | ORAL | Status: DC

## 2020-09-01 MED ORDER — ONDANSETRON HCL 4 MG PO TABS: 4.0000 mg | ORAL_TABLET | Freq: Four times a day (QID) | ORAL | Status: DC | PRN

## 2020-09-01 MED ORDER — ZINC SULFATE 220 (50 ZN) MG PO CAPS
220.0000 mg | ORAL_CAPSULE | Freq: Every day | ORAL | Status: DC
  Administered 2020-09-01 – 2020-09-03 (×3): 220 mg via ORAL
  Filled 2020-09-01 (×3): qty 1

## 2020-09-01 MED ORDER — SODIUM CHLORIDE 0.9 % IV SOLN
500.0000 mg | INTRAVENOUS | Status: DC
  Administered 2020-09-02: 500 mg via INTRAVENOUS
  Filled 2020-09-01 (×2): qty 500

## 2020-09-01 MED ORDER — CARVEDILOL 3.125 MG PO TABS
3.1250 mg | ORAL_TABLET | Freq: Two times a day (BID) | ORAL | Status: DC
  Administered 2020-09-01 – 2020-09-03 (×4): 3.125 mg via ORAL
  Filled 2020-09-01 (×4): qty 1

## 2020-09-01 MED ORDER — HEPARIN BOLUS VIA INFUSION
2000.0000 [IU] | Freq: Once | INTRAVENOUS | Status: AC
  Administered 2020-09-01: 2000 [IU] via INTRAVENOUS
  Filled 2020-09-01: qty 2000

## 2020-09-01 MED ORDER — HYDROCOD POLST-CPM POLST ER 10-8 MG/5ML PO SUER: 5.0000 mL | Freq: Two times a day (BID) | ORAL | Status: DC | PRN

## 2020-09-01 MED ORDER — GABAPENTIN 300 MG PO CAPS
300.0000 mg | ORAL_CAPSULE | Freq: Three times a day (TID) | ORAL | Status: DC
  Administered 2020-09-01 – 2020-09-03 (×7): 300 mg via ORAL
  Filled 2020-09-01 (×7): qty 1

## 2020-09-01 MED ORDER — HEPARIN BOLUS VIA INFUSION
1150.0000 [IU] | Freq: Once | INTRAVENOUS | Status: AC
  Administered 2020-09-01: 1150 [IU] via INTRAVENOUS
  Filled 2020-09-01: qty 1150

## 2020-09-01 MED ORDER — SODIUM CHLORIDE 0.9 % IV SOLN
1.0000 g | INTRAVENOUS | Status: DC
  Administered 2020-09-02: 1 g via INTRAVENOUS
  Filled 2020-09-01: qty 1
  Filled 2020-09-01: qty 10

## 2020-09-01 MED ORDER — METHYLPREDNISOLONE SODIUM SUCC 40 MG IJ SOLR
0.5000 mg/kg | Freq: Two times a day (BID) | INTRAMUSCULAR | Status: DC
  Administered 2020-09-01: 19.2 mg via INTRAVENOUS
  Filled 2020-09-01: qty 1

## 2020-09-01 MED ORDER — SODIUM CHLORIDE 0.9 % IV SOLN: 100.0000 mg | Freq: Every day | INTRAVENOUS | Status: DC

## 2020-09-01 MED ORDER — ONDANSETRON HCL 4 MG/2ML IJ SOLN: 4.0000 mg | Freq: Four times a day (QID) | INTRAMUSCULAR | Status: DC | PRN

## 2020-09-01 MED ORDER — POTASSIUM CHLORIDE CRYS ER 20 MEQ PO TBCR
40.0000 meq | EXTENDED_RELEASE_TABLET | ORAL | Status: AC
  Administered 2020-09-01 (×2): 40 meq via ORAL
  Filled 2020-09-01 (×2): qty 2

## 2020-09-01 MED ORDER — SODIUM CHLORIDE 0.9 % IV SOLN: 200.0000 mg | Freq: Once | INTRAVENOUS | Status: DC

## 2020-09-01 MED ORDER — ALPRAZOLAM 0.25 MG PO TABS
0.2500 mg | ORAL_TABLET | Freq: Every evening | ORAL | Status: DC | PRN
  Administered 2020-09-01 – 2020-09-03 (×2): 0.25 mg via ORAL
  Filled 2020-09-01 (×2): qty 1

## 2020-09-01 MED ORDER — LEVOFLOXACIN IN D5W 750 MG/150ML IV SOLN: 750.0000 mg | INTRAVENOUS | Status: DC

## 2020-09-01 MED ORDER — GUAIFENESIN-DM 100-10 MG/5ML PO SYRP: 10.0000 mL | ORAL_SOLUTION | ORAL | Status: DC | PRN

## 2020-09-01 MED ORDER — ATORVASTATIN CALCIUM 80 MG PO TABS
80.0000 mg | ORAL_TABLET | Freq: Every day | ORAL | Status: DC
  Administered 2020-09-01 – 2020-09-03 (×3): 80 mg via ORAL
  Filled 2020-09-01 (×3): qty 1

## 2020-09-01 MED ORDER — LEVOFLOXACIN IN D5W 750 MG/150ML IV SOLN
750.0000 mg | INTRAVENOUS | Status: DC
  Administered 2020-09-01: 750 mg via INTRAVENOUS
  Filled 2020-09-01: qty 150

## 2020-09-01 NOTE — Progress Notes (Signed)
PHARMACIST - PHYSICIAN COMMUNICATION  CONCERNING:  Enoxaparin (Lovenox) for DVT Prophylaxis    RECOMMENDATION: Patient was prescribed enoxaprin 40mg  q24 hours for VTE prophylaxis.   Filed Weights   08/31/20 2018  Weight: 38.6 kg (85 lb)    Body mass index is 17.77 kg/m.  Estimated Creatinine Clearance: 38.6 mL/min (by C-G formula based on SCr of 0.91 mg/dL).   Patient is candidate for enoxaparin 30mg  every 24 hours based on CrCl <85ml/min or Weight <45kg  DESCRIPTION: Pharmacy has adjusted enoxaparin dose per [facility] policy.  Patient is now receiving enoxaparin _30___mg every _24__ hours    31m, PharmD Clinical Pharmacist  09/01/2020 5:01 AM

## 2020-09-01 NOTE — Progress Notes (Addendum)
Pt refused bed alarm but was educated about safety. Pt reported that have not sleep for two days. Notify Jon Billings. Will continue to monitor.  Update 2125: NP Jon Billings ordered alprazolam 0.25 mg oral at bedtime PRN. Will continue to monitor.

## 2020-09-01 NOTE — Consult Note (Addendum)
Cardiology Consultation:   Patient ID: Charlotte Castaneda; [id number]; Aug 04, 1956   Admit date: 08/31/2020 Date of Consult: 09/01/2020  Primary Care Provider: Patient, No Pcp Per Primary Cardiologist: New to [facility] - consult by End (followed in [place]) Primary Electrophysiologist:  None   Patient Profile:   Charlotte Castaneda is a 64 y.o. female with a hx of CAD status post LAD stenting in 2008 (daughter indicates) vs 2010 (Care Everywhere), extensive PAD history as outlined below, chronic pancreatitis, HTN, HLD, chronic pain syndrome, COPD, and tobacco use quitting approximately 6 weeks ago who was recently diagnosed with COVID-19 approximately 19 days prior who is in the [place] area visiting her daughter and is being seen today for the evaluation of NSTEMI in the setting of presumed COVID-19 PNA at the request of Dr. Nelson Chimes.  History of Present Illness:   Charlotte Castaneda resides in [place] and is followed by cardiology and vascular surgery there.  She is in the [place], [place] area visiting her daughter.  She has history of CAD status post LAD stenting in in either 2008 or 2010 with further details being unclear.  She has an extensive history of PAD status post bilateral kissing common iliac stents, left-sided CIA stenting in 03/2017 for in-stent restenosis, bilateral common iliac artery kissing angioplasty and right mid to distal common iliac artery stenting in 04/2018, left femoral endarterectomy and external iliac artery stenting in 12/2018 for acute on chronic limb ischemia with left foot TMA in 02/2020 despite femoropopliteal bypass.  She had a Covid test at an outside facility with further details unclear.  Multiple family members are positive.  Following this, she received monoclonal antibody infusion on 9/12 though continued to have significant dyspnea.  She is not vaccinated for COVID-19.  In this setting, she presented to [facility] ED where she was hypoxic with oxygen  saturations in the upper 80s percent on room air requiring supplemental oxygen via nasal cannula at 2 L.  Febrile with a T-max of 102.8.  BP stable.  Initial high-sensitivity troponin 500 with a delta of 1063 trending to 2163 currently.  BNP 1778.  D-dimer elevated at 3027.  Other significant labs include potassium 3.3, BUN 10, serum creatinine 0.91, WBC 11.7, Hgb 9.9, PLT 146, blood cultures no growth x2 to date, PCT 0.93.  Chest x-ray showed COPD with multifocal interstitial opacity possibly chronic versus superimposed viral infection.  EKG showed NSR, 99 bpm, nonspecific inferolateral st/t changes. Repeat EKG showed NSR, 68 bpm, LVH, prolonged QT, possible prior inferior infarct, improved st/t changes.  Since her admission, she was received aspirin 324 mg x 1, IV steroids, IV heparin, and remdesivir.  Cardiology is asked to evaluate her elevated troponin.    Past Medical History:  Diagnosis Date  . CAD (coronary artery disease)   . Chronic pancreatitis (HCC)   . COPD (chronic obstructive pulmonary disease) (HCC)   . HTN (hypertension)   . MI (myocardial infarction) (HCC)   . PAD (peripheral artery disease) (HCC)     Past Surgical History:  Procedure Laterality Date  . AMPUTATION TOE       Home Meds: Prior to Admission medications   Medication Sig Start Date End Date Taking? Authorizing Provider  albuterol (VENTOLIN HFA) 108 (90 Base) MCG/ACT inhaler Inhale into the lungs. 08/31/20  Yes [provider]  benzonatate (TESSALON) 100 MG capsule Take 1-2 capsules by mouth 3 (three) times daily as needed. If 1 is not helping you can take 2 at a  time. Do not take more than 6 hours in 24 hours. 08/31/20 09/10/20 Yes [provider]  gabapentin (NEURONTIN) 300 MG capsule Take 300 mg by mouth 3 (three) times daily. 08/28/20  Yes [provider]    Inpatient Medications: Scheduled Meds: . vitamin C  500 mg Oral Daily  . atorvastatin  40 mg Oral Daily  . gabapentin  300 mg  Oral TID  . Ipratropium-Albuterol  1 puff Inhalation Q6H  . methylPREDNISolone (SOLU-MEDROL) injection  0.5 mg/kg Intravenous Q12H   Followed by  . [START ON 09/04/2020] predniSONE  50 mg Oral Daily  . zinc sulfate  220 mg Oral Daily   Continuous Infusions: . [START ON 09/02/2020] azithromycin    . [START ON 09/02/2020] cefTRIAXone (ROCEPHIN)  IV    . heparin 450 Units/hr (09/01/20 1032)  . remdesivir 100 mg in NS 100 mL 100 mg (09/01/20 0934)   PRN Meds: acetaminophen, chlorpheniramine-HYDROcodone, guaiFENesin-dextromethorphan, ondansetron **OR** ondansetron (ZOFRAN) IV  Allergies:  No Known Allergies  Social History:   Social History   Socioeconomic History  . Marital status: Single    Spouse name: Not on file  . Number of children: Not on file  . Years of education: Not on file  . Highest education level: Not on file  Occupational History  . Not on file  Tobacco Use  . Smoking status: Former Games developer  . Smokeless tobacco: Never Used  Substance and Sexual Activity  . Alcohol use: Never  . Drug use: Never  . Sexual activity: Not on file  Other Topics Concern  . Not on file  Social History Narrative  . Not on file   Social Determinants of Health   Financial Resource Strain:   . Difficulty of Paying Living Expenses: Not on file  Food Insecurity:   . Worried About Programme researcher, broadcasting/film/video in the Last Year: Not on file  . Ran Out of Food in the Last Year: Not on file  Transportation Needs:   . Lack of Transportation (Medical): Not on file  . Lack of Transportation (Non-Medical): Not on file  Physical Activity:   . Days of Exercise per Week: Not on file  . Minutes of Exercise per Session: Not on file  Stress:   . Feeling of Stress : Not on file  Social Connections:   . Frequency of Communication with Friends and Family: Not on file  . Frequency of Social Gatherings with Friends and Family: Not on file  . Attends Religious Services: Not on file  . Active Member of Clubs  or Organizations: Not on file  . Attends Banker Meetings: Not on file  . Marital Status: Not on file  Intimate Partner Violence:   . Fear of Current or Ex-Partner: Not on file  . Emotionally Abused: Not on file  . Physically Abused: Not on file  . Sexually Abused: Not on file     Family History:   Family History  Problem Relation Age of Onset  . Cancer Mother   . Heart disease Mother   . Stroke Mother   . Heart attack Mother   . Clotting disorder Mother   . Cancer Father     ROS:  ROS    Not performed in the setting of the COVID-19 pandemic.  Please see MD attestation.  Physical Exam/Data:   Vitals:   09/01/20 0300 09/01/20 0454 09/01/20 0813 09/01/20 1142  BP:  129/66 134/72 (!) 176/84  Pulse: 72 67 71 83  Resp:  18 18 14 18   Temp:  97.7 F (36.5 C) 99.2 F (37.3 C) 97.7 F (36.5 C)  TempSrc:   Oral Oral  SpO2: 90% 95% 100% 100%  Weight:      Height:       No intake or output data in the 24 hours ending 09/01/20 1340 Filed Weights   08/31/20 2018  Weight: 38.6 kg   Body mass index is 17.77 kg/m.   Physical Exam: Not performed in the setting of the COVID-19 pandemic.  Please see MD attestation.  EKG:  The EKG was personally reviewed and demonstrates: NSR, 99 bpm, nonspecific inferolateral st/t changes. Repeat EKG showed NSR, 68 bpm, LVH, prolonged QT, possible prior inferior infarct, improved st/t changes Telemetry:  Telemetry was personally reviewed and demonstrates: SR  Weights: 09/02/20   08/31/20 2018  Weight: 38.6 kg    Relevant CV Studies:  2D echo 09/01/2020: 1. Left ventricular ejection fraction, by estimation, is 45 to 50%. The  left ventricle has mildly decreased function. The left ventricle  demonstrates regional wall motion abnormalities (see scoring  diagram/findings for description). Left ventricular  diastolic parameters are consistent with Grade II diastolic dysfunction  (pseudonormalization). Elevated left atrial  pressure. There is severe  hypokinesis of the left ventricular, basal inferoseptal wall and inferior  wall. There is severe hypokinesis of  the left ventricular, basal anteroseptal wall.  2. Right ventricular systolic function is normal. The right ventricular  size is normal.  3. Left atrial size was mildly dilated.  4. The mitral valve is normal in structure. Mild to moderate mitral valve  regurgitation. No evidence of mitral stenosis.  5. Tricuspid valve regurgitation is mild to moderate.  6. The aortic valve has an indeterminant number of cusps. Aortic valve  regurgitation is mild. No aortic stenosis is present.    Laboratory Data:  Chemistry Recent Labs  Lab 08/31/20 2042  NA 138  K 3.3*  CL 102  CO2 23  GLUCOSE 135*  BUN 10  CREATININE 0.91  CALCIUM 8.5*  GFRNONAA >60  GFRAA >60  ANIONGAP 13    Recent Labs  Lab 08/31/20 2042  PROT 7.8  ALBUMIN 3.4*  AST 21  ALT 15  ALKPHOS 101  BILITOT 0.6   Hematology Recent Labs  Lab 08/31/20 2042  WBC 11.7*  RBC 2.49*  HGB 9.9*  HCT 28.6*  MCV 114.9*  MCH 39.8*  MCHC 34.6  RDW 13.9  PLT 146*   Cardiac EnzymesNo results for input(s): TROPONINI in the last 168 hours. No results for input(s): TROPIPOC in the last 168 hours.  BNP Recent Labs  Lab 08/31/20 2042  BNP 1,778.0*    DDimer No results for input(s): DDIMER in the last 168 hours.  Radiology/Studies:  DG Chest Port 1 View  Result Date: 08/31/2020 IMPRESSION: COPD with multifocal interstitial opacity that may be chronic or indicative of superimposed viral infection. Electronically Signed   By: 09/02/2020 M.D.   On: 08/31/2020 20:35    Assessment and Plan:   1.  CAD involving the native coronary arteries with elevated troponin concerning for NSTEMI versus myocarditis: -Patient has previously been managed in 09/02/2020 state with further testing details unclear and unavailable for review in Care Everywhere -Likely in the setting of the  patient's underlying presumed COVID-19 pneumonia -Continue to cycle troponin until peak -Consider CTA chest given recent travel across the country and COVID-19 status in the setting of elevated D-dimer of note, patient with normal RV systolic function  and ventricular cavity size -ASA -Heparin drip -Echo with EF of 45 to 50% as outlined above with severe  hypokinesis of the left ventricular, basal inferoseptal wall and inferior wall, as well as severe hypokinesis of the left ventricular, basal anteroseptal wall -Initial diagnosis of COVID-19 occurred on 9/9 and she is status post monoclonal antibody therapy, currently being treated with remdesivir and steroids per IM -Ultimately, she will need cardiac cath with timing to be determined given her Covid status  -Obtain lipid panel and A1c for further risk stratification  2.  Acute hypoxic respiratory distress secondary to presumed COVID-19 pneumonia and AECOPD: -Patient previously unvaccinated for COVID-19 -Status post monoclonal antibody therapy as outlined above on 9/12 -Recommend checking COVID test, none available for review -Transitioned back to room air with O2 saturations 100% -D-dimer noted to be elevated in the setting of COVID-19 and recent prolonged travel, consider CTA chest, defer to internal medicine if indicated -Management per internal medicine  3.  Systolic dysfunction:: -Echo with mildly reduced LVSF with wall motion abnormalities as outlined above -Ischemic evaluation pending MD discussion as outlined above -Add low-dose carvedilol 3.125 mg twice daily -Continue to escalate GDMT throughout the admission as possible  4.  PAD: -She has an extensive history of peripheral vascular disease as outlined above -Continue current medical therapy including aspirin, will discuss Plavix with MD  5.  Hyperlipidemia: -Check lipid panel -Titrate Lipitor to 80 mg daily given extensive vascular disease history  6.   Hypokalemia: -Recommend repletion to goal 4.0 -Check magnesium with recommendation to replete to goal 2.0  7.  HTN: -Blood pressure suboptimally controlled -At carvedilol as outlined above -Escalate medical therapy as indicated      For questions or updates, please contact [facility] Please consult [email or web address] for contact info under Cardiology/STEMI.   Signed, Eula Listen, PA-C [facility] Pager: [phone] 09/01/2020, 1:40 PM

## 2020-09-01 NOTE — Progress Notes (Addendum)
PROGRESS NOTE    Charlotte Castaneda  OJJ:[id number] DOB: 18-Nov-1956 DOA: 08/31/2020 PCP: Patient, No Pcp Per   Brief Narrative: Taken from H&P Charlotte Castaneda is a 64 y.o. 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 Apparently patient traveled from [place] to [place] on August 15, 2020.  Earlier in September her daughter's family got sick with Covid.  Patient also developed fever and some shortness of breath.  They resumed it is Covid and she was never tested but did received monoclonal antibodies 2 days ago.  No Covid test done on admission.  She came to ED with chest pain radiating to left arm and associated with some dyspnea and nausea.  She had elevated inflammatory markers.  Procalcitonin elevated.  She was started on remdesivir, steroid and antibiotics for concern of superadded bacterial infection. Patient is vaccinated.  Troponin elevated which continue to rise, more than 2000 today, cardiology was consulted for concern of NSTEMI and she was started on IV heparin infusion.  Subjective: Patient denies any chest pain or shortness of breath when seen today. Stating that she already had stents placed. She wants to communicate with her daughter stating that she forgets things easily.  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 No formal diagnosis of  Covid yet. Patient is unvaccinated.  Although she told me that she is vaccinated but daughter declined that information stating that she is very poor historian and refused vaccine.   She was saturating well on room air this morning. She was requiring 2 L on admission. Initially met sepsis criteria with fever, leukocytosis, tachypnea and tachycardia along with COVID-19 infection. Fever, tachycardia and tachypnea resolved. Elevated procalcitonin and inflammatory markers. She did received one dose of monoclonal antibodies and started on remdesivir and steroid for worsening symptoms. -Do Covid test as it was not done on admission and there is no documentation. -Continue remdesivir and steroid-day 2 -Continue ceftriaxone for 5 days. -Continue Zithromax for 3 days. -Continue supportive care. -Continue to monitor inflammatory markers. -Supplemental oxygen as needed to keep saturation above 90%.  Elevated troponin/NSTEMI/history of CAD. Patient with history of PAD, CAD s/p PCI.  Per daughter patient had a stent placed in her coronaries in 2008, last year she had a stent placed in her lower abdominal aorta and iliac arteries. Per daughter she did had chest pain radiating to left arm before coming to hospital along with some dyspnea and nausea. Troponin continue to rise, currently more than 2000. No acute changes on EKG. no chest pain. Concern of NSTEMI.  Echocardiogram with EF of 40 to 45% along with left wall motion abnormalities. -Consult cardiology-we will appreciate their recommendations. -Start her on heparin infusion.  Hypokalemia.  Potassium at 3.3 on admission. -Check magnesium. -Replete potassium and monitor.  Peripheral vascular disease. S/p amputation of left forefoot. No acute concern.  Per daughter she was on Plavix and Lipitor but she did not solve Lipitor in her meds this  time.  Patient is from [place], no records available.  She was visiting her daughter here. -Continue  Plavix-will ask cardiology if they want to hold before procedure. -Restart Lipitor.  Essential hypertension.  Blood pressure within goal. -Continue to monitor.  Chronic pain syndrome. -Continue home dose of gabapentin.   Objective: Vitals:   09/01/20 0224 09/01/20 0300 09/01/20 0454 09/01/20 0813  BP: (!) 149/99  129/66 134/72  Pulse: 82 72 67 71  Resp: 20 18 18 14  Temp: 97.9 F (36.6 C)  97.7 F (36.5 C) 99.2 F (37.3 C)  TempSrc: Oral   Oral  SpO2: 99% 90% 95% 100%  Weight:      Height:       No intake or output data in the 24 hours ending 09/01/20 0852 Filed Weights   08/31/20 2018  Weight: 38.6 kg    Examination:  General exam: Frail lady, appears calm and comfortable  Respiratory system: Clear to auscultation. Respiratory effort normal. Cardiovascular system: S1 & S2 heard, RRR. No JVD, murmurs, Gastrointestinal system: Soft, nontender, nondistended, bowel sounds positive. Central nervous system: Alert and oriented. No focal neurological deficits. Extremities: No edema, no cyanosis, pulses intact and symmetrical.  Left forefoot amputation. Psychiatry: Judgement and insight appear normal. Mood & affect appropriate.    DVT prophylaxis: Heparin Code Status: Full  Family Communication: Daughter was updated on phone. Disposition Plan:  Status is: Inpatient  Remains inpatient appropriate because:Inpatient level of care appropriate due to severity of illness   Dispo: The patient is from: Home              Anticipated d/c is to: Home              Anticipated d/c date is: 2 days              Patient currently is not medically stable to d/c.  Consultants:   Cardiology  Procedures:  Antimicrobials:  Ceftriaxone Zithromax  Data Reviewed: I have personally reviewed following labs and imaging studies  CBC: Recent Labs  Lab 08/31/20 2042  WBC 11.7*  NEUTROABS 10.2*  HGB 9.9*  HCT 28.6*  MCV 114.9*  PLT 146*   Basic Metabolic Panel: Recent Labs   Lab 08/31/20 2042  NA 138  K 3.3*  CL 102  CO2 23  GLUCOSE 135*  BUN 10  CREATININE 0.91  CALCIUM 8.5*   GFR: Estimated Creatinine Clearance: 38.6 mL/min (by C-G formula based on SCr of 0.91 mg/dL). Liver Function Tests: Recent Labs  Lab 08/31/20 2042  AST 21  ALT 15  ALKPHOS 101  BILITOT 0.6  PROT 7.8  ALBUMIN 3.4*   No results for input(s): LIPASE, AMYLASE in the last 168 hours. No results for input(s): AMMONIA in the last 168 hours. Coagulation Profile: No results for input(s): INR, PROTIME in the last 168 hours. Cardiac Enzymes: No results for input(s): CKTOTAL, CKMB, CKMBINDEX, TROPONINI in the last 168 hours. BNP (last 3 results) No results for input(s): PROBNP in the last 8760 hours. HbA1C: No results for input(s): HGBA1C in the last 72 hours. CBG: No results for input(s): GLUCAP in the last 168 hours. Lipid Profile: Recent Labs    08/31/20 2042  TRIG 152*   Thyroid Function Tests: No results for input(s): TSH, T4TOTAL, FREET4, T3FREE, THYROIDAB in the last 72 hours. Anemia Panel: Recent Labs    08/31/20 2042  FERRITIN 121   Sepsis Labs: Recent Labs  Lab 08/31/20 2042 08/31/20 2228  PROCALCITON 0.93  --     LATICACIDVEN 1.5 1.0    Recent Results (from the past 240 hour(s))  Blood Culture (routine x 2)     Status: None (Preliminary result)   Collection Time: 08/31/20  8:42 PM   Specimen: BLOOD  Result Value Ref Range Status   Specimen Description BLOOD RIGHT HAND  Final   Special Requests   Final    BOTTLES DRAWN AEROBIC AND ANAEROBIC Blood Culture adequate volume   Culture   Final    NO GROWTH < 12 HOURS Performed at [facility], [address], [place], [place] [address]    Report Status PENDING  Incomplete  Blood Culture (routine x 2)     Status: None (Preliminary result)   Collection Time: 08/31/20  8:42 PM   Specimen: BLOOD  Result Value Ref Range Status   Specimen Description BLOOD LEFT AC  Final   Special Requests    Final    BOTTLES DRAWN AEROBIC AND ANAEROBIC Blood Culture adequate volume   Culture   Final    NO GROWTH < 12 HOURS Performed at [facility], [address] [place], [place] [address]    Report Status PENDING  Incomplete     Radiology Studies: DG Chest Port 1 View  Result Date: 08/31/2020 CLINICAL DATA:  COVID-19 EXAM: PORTABLE CHEST 1 VIEW COMPARISON:  None. FINDINGS: Lungs are hyperinflated with diffuse interstitial opacity, greatest at the lung bases. Normal pleural spaces. Normal cardiomediastinal contours. IMPRESSION: COPD with multifocal interstitial opacity that may be chronic or indicative of superimposed viral infection. Electronically Signed   By: Ulyses Jarred M.D.   On: 08/31/2020 20:35    Scheduled Meds: . vitamin C  500 mg Oral Daily  . gabapentin  300 mg Oral TID  . Ipratropium-Albuterol  1 puff Inhalation Q6H  . methylPREDNISolone (SOLU-MEDROL) injection  0.5 mg/kg Intravenous Q12H   Followed by  . [START ON 09/04/2020] predniSONE  50 mg Oral Daily  . zinc sulfate  220 mg Oral Daily   Continuous Infusions: . [START ON 09/02/2020] levofloxacin (LEVAQUIN) IV    . remdesivir 100 mg in NS 100 mL       LOS: 0 days   Time spent: 40 minutes.  Lorella Nimrod, MD [facility]  If 7PM-7AM, please contact night-coverage [email or web address]  09/01/2020, 8:52 AM   This record has been created using Systems analyst. Errors have been sought and corrected,but may not always be located. Such creation errors do not reflect on the standard of care.

## 2020-09-01 NOTE — Progress Notes (Signed)
Notified Dr. Nelson Chimes of the critical lab values related to troponin.  Dr. Nelson Chimes will place new orders.

## 2020-09-01 NOTE — Consult Note (Signed)
ANTICOAGULATION CONSULT NOTE  Pharmacy Consult for Heparin Infusion Indication: chest pain/ACS  Patient Measurements: Height: 4\' 10"  (147.3 cm) Weight: 38.6 kg (85 lb) IBW/kg (Calculated) : 40.9 Heparin Dosing Weight: 38.6 kg  Labs: Recent Labs    08/31/20 2042 08/31/20 2228 09/01/20 0423  HGB 9.9*  --   --   HCT 28.6*  --   --   PLT 146*  --   --   CREATININE 0.91  --   --   TROPONINIHS 500* 1,063* 2,163*    Estimated Creatinine Clearance: 38.6 mL/min (by C-G formula based on SCr of 0.91 mg/dL).  Assessment: Patient is a 64 y/o F with medical history including CAD, PVD who is admitted with COVID-19 pneumonia. EKG with QTc prolongation. Troponin 500 >> 1063 >> 2163. Pharmacy has been consulted to initiate heparin for suspected ACS.   Baseline aPTT, PT-INR pending. CBC with Hgb 9.9, platelets 146 which appears c/w baseline.  Goal of Therapy:  Heparin level 0.3-0.7 units/ml Monitor platelets by anticoagulation protocol: Yes   Plan:  --Heparin 2000 unit IV bolus x 1 followed by continuous infusion at 450 units/hr --HL 6 hours after initiation of infusion --Daily CBC per protocol  2164 09/01/2020,8:49 AM

## 2020-09-01 NOTE — Progress Notes (Signed)
Spoke to patient's daughter Asher Muir. Updated her on patient's status. Asher Muir had just spoken to [facility]. I will contact Asher Muir with updates during my shift.

## 2020-09-01 NOTE — Consult Note (Signed)
ANTICOAGULATION CONSULT NOTE  Pharmacy Consult for Heparin Infusion Indication: chest pain/ACS  Patient Measurements: Height: 4\' 10"  (147.3 cm) Weight: 38.6 kg (85 lb) IBW/kg (Calculated) : 40.9 Heparin Dosing Weight: 38.6 kg  Labs: Recent Labs    08/31/20 2042 08/31/20 2228 09/01/20 0423 09/01/20 0946 09/01/20 1734  HGB 9.9*  --   --   --   --   HCT 28.6*  --   --   --   --   PLT 146*  --   --   --   --   APTT  --   --   --  58*  --   LABPROT  --   --   --  12.3  --   INR  --   --   --  1.0  --   HEPARINUNFRC  --   --   --   --  <0.10*  CREATININE 0.91  --   --   --   --   TROPONINIHS 500* 1,063* 2,163*  --   --     Estimated Creatinine Clearance: 38.6 mL/min (by C-G formula based on SCr of 0.91 mg/dL).  Assessment: Patient is a 64 y/o F with medical history including CAD, PVD who is admitted with COVID-19 pneumonia. EKG with QTc prolongation. Troponin 500 >> 1063 >> 2163. Pharmacy has been consulted to initiate heparin for suspected ACS.   Baseline aPTT, PT-INR pending. CBC with Hgb 9.9, platelets 146 which appears c/w baseline.  9/14 @ 1809 HL < 0.10. Subtherapeutic.   Goal of Therapy:  Heparin level 0.3-0.7 units/ml Monitor platelets by anticoagulation protocol: Yes   Plan:  -- Spoke with nurse 10/14) and was told that the patient went for a CT scan earlier today and the heparin infusion was running when the patient left the floor and returned. She confirmed no heparin interruptions.   --Heparin 1150 unit IV bolus x 1 followed by continuous infusion rate increase at 550 units/hr. D/w changes with nursing who was in the room when I called.   --HL 6 hours after initiation of infusion  --Daily CBC per protocol  Sheria Lang 09/01/2020,6:11 PM

## 2020-09-01 NOTE — Progress Notes (Signed)
Pt transferred to 2A in stable condition with Heparin drip infusing.

## 2020-09-01 NOTE — H&P (Signed)
History and Physical   Charlotte Castaneda ZOX:[id number] DOB: May 11, 1956 DOA: 08/31/2020  Referring MD/NP/PA: Dr. Marisa Severin  PCP: Patient, No Pcp Per   Outpatient Specialists: None  Patient coming from: Home  Chief Complaint: Shortness of breath and cough  HPI: Charlotte Castaneda is a 64 y.o. female with medical history significant of coronary artery disease, chronic pain syndrome, essential hypertension, peripheral vascular disease status post left forefoot amputation, previous tobacco abuse quit about 6 weeks ago who apparently was diagnosed with COVID-19 infection according to patient about 19 days ago.  She was noted to have had the test done on September 9 which was 4 days ago at [facility].  Patient came in with worsening shortness of breath.  Apparently she had monoclonal antibody infusion 2 days ago but continued to get worse with significant shortness of breath cough and wheezing.  Patient came to the ER where she was seen and evaluated.  She is hypoxic requiring at least 2 L of oxygen.  She appears to be very anxious and worried.  Her oxygen sats are in the 80s on room air.  She quit smoking about 6 weeks ago and was never diagnosed with COPD but patient appears to be having some wheezing in addition to her respiratory failure.  Patient is now requiring 2 L of oxygen.  She is being admitted with COVID-19 pneumonia with hypoxia..  ED Course: Temperature 102.8, blood pressure 160/70 pulse 113 was 124 oxygen sat 89% room air.  White count 11.7 hemoglobin 9.9 and platelets 146 potassium 3.3 calcium 8.5 and glucose 135.  Otherwise chemistry appears to be within normal.  Troponin initially 500 now 1063.  Glucose is 152 ferritin 121 lactic acid 1.5 and procalcitonin 0.90.  BNP of 12/25/1976.  LDH is 200.  Patient being admitted with acute hypoxic respiratory failure secondary to COVID-19 infection with possible secondary pneumonia infection and elevated troponin.  Probably myocardial injury from  Covid.  Review of Systems: As per HPI otherwise 10 point review of systems negative.    Past Medical History:  Diagnosis Date  . MI (myocardial infarction) [facility])     Past Surgical History:  Procedure Laterality Date  . AMPUTATION TOE       reports that she has quit smoking. She has never used smokeless tobacco. She reports that she does not drink alcohol and does not use drugs.  Not on File  History reviewed. No pertinent family history.   Prior to Admission medications   Not on File    Physical Exam: Vitals:   08/31/20 2230 08/31/20 2300 08/31/20 2330 08/31/20 2344  BP: (!) 162/70 138/72 117/84   Pulse: 85 84 86 81  Resp: 19 20 19 18   Temp:      TempSrc:      SpO2: 96% 93% (!) 89% 96%  Weight:      Height:          Constitutional: Anxious, acutely ill looking in no distress Vitals:   08/31/20 2230 08/31/20 2300 08/31/20 2330 08/31/20 2344  BP: (!) 162/70 138/72 117/84   Pulse: 85 84 86 81  Resp: 19 20 19 18   Temp:      TempSrc:      SpO2: 96% 93% (!) 89% 96%  Weight:      Height:       Eyes: PERRL, lids and conjunctivae normal ENMT: Mucous membranes are moist. Posterior pharynx clear of any exudate or lesions.Normal dentition.  Neck: normal, supple, no masses, no thyromegaly Respiratory: Decreased  air entry bilaterally with expiratory wheezing with some rhonchi normal respiratory effort. No accessory muscle use.  Cardiovascular: Sinus tachycardia, no murmurs / rubs / gallops. No extremity edema. 2+ pedal pulses. No carotid bruits.  Abdomen: no tenderness, no masses palpated. No hepatosplenomegaly. Bowel sounds positive.  Musculoskeletal: no clubbing / cyanosis. No joint deformity upper and lower extremities. Good ROM, no contractures. Normal muscle tone.  Skin: no rashes, lesions, ulcers. No induration Neurologic: CN 2-12 grossly intact. Sensation intact, DTR normal. Strength 5/5 in all 4.  Psychiatric: Normal judgment and insight. Alert and oriented x  3.  Anxious mood.     Labs on Admission: I have personally reviewed following labs and imaging studies  CBC: Recent Labs  Lab 08/31/20 2042  WBC 11.7*  NEUTROABS 10.2*  HGB 9.9*  HCT 28.6*  MCV 114.9*  PLT 146*   Basic Metabolic Panel: Recent Labs  Lab 08/31/20 2042  NA 138  K 3.3*  CL 102  CO2 23  GLUCOSE 135*  BUN 10  CREATININE 0.91  CALCIUM 8.5*   GFR: Estimated Creatinine Clearance: 38.6 mL/min (by C-G formula based on SCr of 0.91 mg/dL). Liver Function Tests: Recent Labs  Lab 08/31/20 2042  AST 21  ALT 15  ALKPHOS 101  BILITOT 0.6  PROT 7.8  ALBUMIN 3.4*   No results for input(s): LIPASE, AMYLASE in the last 168 hours. No results for input(s): AMMONIA in the last 168 hours. Coagulation Profile: No results for input(s): INR, PROTIME in the last 168 hours. Cardiac Enzymes: No results for input(s): CKTOTAL, CKMB, CKMBINDEX, TROPONINI in the last 168 hours. BNP (last 3 results) No results for input(s): PROBNP in the last 8760 hours. HbA1C: No results for input(s): HGBA1C in the last 72 hours. CBG: No results for input(s): GLUCAP in the last 168 hours. Lipid Profile: Recent Labs    08/31/20 2042  TRIG 152*   Thyroid Function Tests: No results for input(s): TSH, T4TOTAL, FREET4, T3FREE, THYROIDAB in the last 72 hours. Anemia Panel: Recent Labs    08/31/20 2042  FERRITIN 121   Urine analysis:    Component Value Date/Time   COLORURINE STRAW (A) 08/31/2020 2042   APPEARANCEUR CLEAR (A) 08/31/2020 2042   LABSPEC 1.005 08/31/2020 2042   PHURINE 7.0 08/31/2020 2042   GLUCOSEU NEGATIVE 08/31/2020 2042   HGBUR NEGATIVE 08/31/2020 2042   BILIRUBINUR NEGATIVE 08/31/2020 2042   KETONESUR NEGATIVE 08/31/2020 2042   PROTEINUR NEGATIVE 08/31/2020 2042   NITRITE NEGATIVE 08/31/2020 2042   LEUKOCYTESUR NEGATIVE 08/31/2020 2042   Sepsis Labs: @LABRCNTIP (procalcitonin:4,lacticidven:4) )No results found for this or any previous visit (from the past  240 hour(s)).   Radiological Exams on Admission: DG Chest Port 1 View  Result Date: 08/31/2020 CLINICAL DATA:  COVID-19 EXAM: PORTABLE CHEST 1 VIEW COMPARISON:  None. FINDINGS: Lungs are hyperinflated with diffuse interstitial opacity, greatest at the lung bases. Normal pleural spaces. Normal cardiomediastinal contours. IMPRESSION: COPD with multifocal interstitial opacity that may be chronic or indicative of superimposed viral infection. Electronically Signed   By: 09/02/2020 M.D.   On: 08/31/2020 20:35    EKG: Independently reviewed.  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  Patient quit smoking but probably has COPD exacerbation in addition to the pneumonia secondary to COVID-19 infection.  Based on patient's history from now to probably this secondary bacterial pneumonia on top of the Covid.  Admit the patient initiate remdesivir and the Covid pathway.  Initiate Levaquin for secondary bacterial pneumonia.  Monitor culture sensitivities.  She appears to be septic based on the criteria.  Continue treatment.  #2 sepsis secondary to COVID-19 pneumonia: Continue treatment as per #1.  #3 elevated troponin: Normal EKG.  This may reflect an represent COVID-19 myocarditis.  We will get echocardiogram.  Continue treatment as above.  Trend enzymes and possible cardiology consult in the morning  #4 coronary artery disease, as per above.  Continue monitoring  #5 essential hypertension: Continue blood pressure monitoring.  #6  chronic pain syndrome: Confirm home regimen and continue  #7 hypokalemia: Replete potassium  #8 peripheral vascular disease: Status post amputation of the left forefoot.  Stable at this point.   DVT prophylaxis: Lovenox Code Status: Full code Family Communication: No family at bedside Disposition Plan: Home Consults called: Possible cardiology consult in the morning Admission status: Inpatient  Severity of Illness: The appropriate patient status for this patient is INPATIENT. Inpatient status is judged to be reasonable and necessary in order to provide the required intensity of service to ensure the patient's safety. The patient's presenting symptoms, physical exam findings, and initial radiographic and laboratory data in the context of their chronic comorbidities is felt to place them at high risk for further clinical deterioration. Furthermore, it is not anticipated that the patient will be medically stable for discharge from the hospital within 2 midnights of admission. The following factors support the patient status of inpatient.   " The patient's presenting symptoms include shortness of breath and cough. " The worrisome physical exam findings include coarse breath sound bilaterally. " The initial radiographic and laboratory data are worrisome because of evidence of pneumonia and elevated troponin. " The chronic co-morbidities include coronary artery disease.   * I certify that at the point of admission it is my clinical judgment that the patient will require inpatient hospital care spanning beyond 2 midnights from the point of admission due to high intensity of service, high risk for further deterioration and high frequency of surveillance required.Lonia Blood MD [facility] Pager [phone]  If 7PM-7AM, please contact night-coverage [email or web address] Password [facility]  09/01/2020, 12:07 AM

## 2020-09-01 NOTE — Progress Notes (Signed)
*  PRELIMINARY RESULTS* Echocardiogram 2D Echocardiogram has been performed.  Charlotte Castaneda 09/01/2020, 9:47 AM

## 2020-09-02 DIAGNOSIS — I5020 Unspecified systolic (congestive) heart failure: Secondary | ICD-10-CM | POA: Insufficient documentation

## 2020-09-02 DIAGNOSIS — I214 Non-ST elevation (NSTEMI) myocardial infarction: Secondary | ICD-10-CM

## 2020-09-02 DIAGNOSIS — J9600 Acute respiratory failure, unspecified whether with hypoxia or hypercapnia: Secondary | ICD-10-CM

## 2020-09-02 DIAGNOSIS — I2510 Atherosclerotic heart disease of native coronary artery without angina pectoris: Secondary | ICD-10-CM

## 2020-09-02 LAB — CBC
HCT: 28.7 % — ABNORMAL LOW (ref 36.0–46.0)
Hemoglobin: 9.4 g/dL — ABNORMAL LOW (ref 12.0–15.0)
MCH: 39.5 pg — ABNORMAL HIGH (ref 26.0–34.0)
MCHC: 32.8 g/dL (ref 30.0–36.0)
MCV: 120.6 fL — ABNORMAL HIGH (ref 80.0–100.0)
Platelets: 155 10*3/uL (ref 150–400)
RBC: 2.38 MIL/uL — ABNORMAL LOW (ref 3.87–5.11)
RDW: 14.2 % (ref 11.5–15.5)
WBC: 9.8 10*3/uL (ref 4.0–10.5)
nRBC: 0 % (ref 0.0–0.2)

## 2020-09-02 LAB — FERRITIN
Ferritin: 105 ng/mL (ref 11–307)
Ferritin: 128 ng/mL (ref 11–307)

## 2020-09-02 LAB — CBC WITH DIFFERENTIAL/PLATELET
Abs Immature Granulocytes: 0.04 10*3/uL (ref 0.00–0.07)
Basophils Absolute: 0 10*3/uL (ref 0.0–0.1)
Basophils Relative: 0 %
Eosinophils Absolute: 0 10*3/uL (ref 0.0–0.5)
Eosinophils Relative: 0 %
HCT: 23.7 % — ABNORMAL LOW (ref 36.0–46.0)
Hemoglobin: 7.9 g/dL — ABNORMAL LOW (ref 12.0–15.0)
Immature Granulocytes: 1 %
Lymphocytes Relative: 10 %
Lymphs Abs: 0.8 10*3/uL (ref 0.7–4.0)
MCH: 39.9 pg — ABNORMAL HIGH (ref 26.0–34.0)
MCHC: 33.3 g/dL (ref 30.0–36.0)
MCV: 119.7 fL — ABNORMAL HIGH (ref 80.0–100.0)
Monocytes Absolute: 0.4 10*3/uL (ref 0.1–1.0)
Monocytes Relative: 5 %
Neutro Abs: 7 10*3/uL (ref 1.7–7.7)
Neutrophils Relative %: 84 %
Platelets: 139 10*3/uL — ABNORMAL LOW (ref 150–400)
RBC: 1.98 MIL/uL — ABNORMAL LOW (ref 3.87–5.11)
RDW: 13.8 % (ref 11.5–15.5)
Smear Review: NORMAL
WBC: 8.3 10*3/uL (ref 4.0–10.5)
nRBC: 0 % (ref 0.0–0.2)

## 2020-09-02 LAB — IRON AND TIBC
Iron: 112 ug/dL (ref 28–170)
Saturation Ratios: 41 % — ABNORMAL HIGH (ref 10.4–31.8)
TIBC: 276 ug/dL (ref 250–450)
UIBC: 164 ug/dL

## 2020-09-02 LAB — C-REACTIVE PROTEIN: CRP: 9.4 mg/dL — ABNORMAL HIGH (ref <1.0)

## 2020-09-02 LAB — RETICULOCYTES
Immature Retic Fract: 23.5 % — ABNORMAL HIGH (ref 2.3–15.9)
RBC.: 2.32 MIL/uL — ABNORMAL LOW (ref 3.87–5.11)
Retic Count, Absolute: 41.3 10*3/uL (ref 19.0–186.0)
Retic Ct Pct: 1.8 % (ref 0.4–3.1)

## 2020-09-02 LAB — COMPREHENSIVE METABOLIC PANEL
ALT: 20 U/L (ref 0–44)
AST: 35 U/L (ref 15–41)
Albumin: 2.7 g/dL — ABNORMAL LOW (ref 3.5–5.0)
Alkaline Phosphatase: 79 U/L (ref 38–126)
Anion gap: 9 (ref 5–15)
BUN: 23 mg/dL (ref 8–23)
CO2: 25 mmol/L (ref 22–32)
Calcium: 9 mg/dL (ref 8.9–10.3)
Chloride: 104 mmol/L (ref 98–111)
Creatinine, Ser: 0.85 mg/dL (ref 0.44–1.00)
GFR calc Af Amer: >60 mL/min (ref >60)
GFR calc non Af Amer: >60 mL/min (ref >60)
Glucose, Bld: 125 mg/dL — ABNORMAL HIGH (ref 70–99)
Potassium: 4.7 mmol/L (ref 3.5–5.1)
Sodium: 138 mmol/L (ref 135–145)
Total Bilirubin: 0.5 mg/dL (ref 0.3–1.2)
Total Protein: 6.3 g/dL — ABNORMAL LOW (ref 6.5–8.1)

## 2020-09-02 LAB — VITAMIN B12: Vitamin B-12: 607 pg/mL (ref 180–914)

## 2020-09-02 LAB — HEPARIN LEVEL (UNFRACTIONATED): Heparin Unfractionated: <0.1 IU/mL — ABNORMAL LOW (ref 0.30–0.70)

## 2020-09-02 LAB — MAGNESIUM: Magnesium: 2 mg/dL (ref 1.7–2.4)

## 2020-09-02 LAB — FOLATE: Folate: 6.6 ng/mL (ref >5.9)

## 2020-09-02 LAB — FIBRIN DERIVATIVES D-DIMER (ARMC ONLY): Fibrin derivatives D-dimer (ARMC): 1528.91 ng/mL (FEU) — ABNORMAL HIGH (ref 0.00–499.00)

## 2020-09-02 MED ORDER — HEPARIN BOLUS VIA INFUSION
1200.0000 [IU] | Freq: Once | INTRAVENOUS | Status: AC
  Administered 2020-09-02: 1200 [IU] via INTRAVENOUS
  Filled 2020-09-02: qty 1200

## 2020-09-02 MED ORDER — LOSARTAN POTASSIUM 25 MG PO TABS
12.5000 mg | ORAL_TABLET | Freq: Every day | ORAL | Status: DC
  Administered 2020-09-02 – 2020-09-03 (×2): 12.5 mg via ORAL
  Filled 2020-09-02 (×2): qty 1

## 2020-09-02 NOTE — Consult Note (Signed)
ANTICOAGULATION CONSULT NOTE  Pharmacy Consult for Heparin Infusion Indication: chest pain/ACS  Patient Measurements: Height: 4\' 10"  (147.3 cm) Weight: 38.6 kg (85 lb) IBW/kg (Calculated) : 40.9 Heparin Dosing Weight: 38.6 kg  Labs: Recent Labs    08/31/20 2042 08/31/20 2042 08/31/20 2228 09/01/20 0423 09/01/20 0946 09/01/20 1734 09/02/20 0057  HGB 9.9*  --   --   --   --   --   --   HCT 28.6*  --   --   --   --   --   --   PLT 146*  --   --   --   --   --   --   APTT  --   --   --   --  58*  --   --   LABPROT  --   --   --   --  12.3  --   --   INR  --   --   --   --  1.0  --   --   HEPARINUNFRC  --   --   --   --   --  <0.10* <0.10*  CREATININE 0.91  --   --   --   --   --   --   TROPONINIHS 500*   < > 1,063* 2,163*  --  1,915*  --    < > = values in this interval not displayed.    Estimated Creatinine Clearance: 38.6 mL/min (by C-G formula based on SCr of 0.91 mg/dL).  Assessment: Patient is a 64 y/o F with medical history including CAD, PVD who is admitted with COVID-19 pneumonia. EKG with QTc prolongation. Troponin 500 >> 1063 >> 2163. Pharmacy has been consulted to initiate heparin for suspected ACS.   Baseline aPTT, PT-INR pending. CBC with Hgb 9.9, platelets 146 which appears c/w baseline.  9/14 @ 1809 HL < 0.10. Subtherapeutic.  9/15 @ 0057 HL < 0.10, subtherapeutic  Goal of Therapy:  Heparin level 0.3-0.7 units/ml Monitor platelets by anticoagulation protocol: Yes   Plan:  --Heparin 1200 unit IV bolus x 1 followed by infusion rate increase to 700 units/hr.  --HL 6 hours after rate increase  --Daily CBC per protocol  10/15 A 09/02/2020,1:49 AM

## 2020-09-02 NOTE — Progress Notes (Signed)
Progress Note  Patient Name: Charlotte Castaneda Date of Encounter: 09/02/2020  Primary Cardiologist: Followed in [facility]  Subjective   No chest pain or dyspnea.  Feeling much better.   Inpatient Medications    Scheduled Meds: . vitamin C  500 mg Oral Daily  . atorvastatin  80 mg Oral Daily  . carvedilol  3.125 mg Oral BID WC  . gabapentin  300 mg Oral TID  . Ipratropium-Albuterol  1 puff Inhalation Q6H  . zinc sulfate  220 mg Oral Daily   Continuous Infusions: . azithromycin    . cefTRIAXone (ROCEPHIN)  IV    . heparin 700 Units/hr (09/02/20 0226)   PRN Meds: acetaminophen, ALPRAZolam, chlorpheniramine-HYDROcodone, guaiFENesin-dextromethorphan, ondansetron **OR** ondansetron (ZOFRAN) IV   Vital Signs    Vitals:   09/01/20 1553 09/01/20 2009 09/01/20 2010 09/02/20 0442  BP: (!) 169/73  (!) 149/77 135/70  Pulse: 69  73 73  Resp: 17     Temp: (!) 97.5 F (36.4 C) 97.6 F (36.4 C) 97.6 F (36.4 C) 98.1 F (36.7 C)  TempSrc: Oral Oral Oral Oral  SpO2: 100%  97% 98%  Weight:    41.7 kg  Height:        Intake/Output Summary (Last 24 hours) at 09/02/2020 0810 Last data filed at 09/02/2020 6948 Gross per 24 hour  Intake 318.83 ml  Output 500 ml  Net -181.17 ml   Filed Weights   08/31/20 2018 09/02/20 0442  Weight: 38.6 kg 41.7 kg    Physical Exam   GEN: Somewhat frail, in no acute distress.  HEENT: Grossly normal.  Neck: Supple, no JVD, carotid bruits, or masses. Cardiac: RRR, no murmurs, rubs, or gallops. No clubbing, cyanosis, edema.  Radials/DP/PT 1+ and equal bilaterally.  Respiratory:  Respirations regular and unlabored, diminished breath sounds bilaterally.   GI: Soft, nontender, nondistended, BS + x 4. MS: no deformity or atrophy. Skin: warm and dry, no rash. Neuro:  Strength and sensation are intact. Psych: AAOx3.  Normal affect.  Labs    Chemistry Recent Labs  Lab 08/31/20 2042 09/02/20 0536  NA 138 138  K 3.3* 4.7  CL 102 104   CO2 23 25  GLUCOSE 135* 125*  BUN 10 23  CREATININE 0.91 0.85  CALCIUM 8.5* 9.0  PROT 7.8 6.3*  ALBUMIN 3.4* 2.7*  AST 21 35  ALT 15 20  ALKPHOS 101 79  BILITOT 0.6 0.5  GFRNONAA >60 >60  GFRAA >60 >60  ANIONGAP 13 9     Hematology Recent Labs  Lab 08/31/20 2042 09/02/20 0536  WBC 11.7* 8.3  RBC 2.49* 1.98*  HGB 9.9* 7.9*  HCT 28.6* 23.7*  MCV 114.9* 119.7*  MCH 39.8* 39.9*  MCHC 34.6 33.3  RDW 13.9 13.8  PLT 146* 139*    Cardiac Enzymes  Recent Labs  Lab 08/31/20 2042 08/31/20 2228 09/01/20 0423 09/01/20 1734  TROPONINIHS 500* 1,063* 2,163* 1,915*      BNP Recent Labs  Lab 08/31/20 2042  BNP 1,778.0*     Lipids  Lab Results  Component Value Date   TRIG 152 (H) 08/31/2020    Radiology    CT ANGIO CHEST PE W OR WO CONTRAST  Result Date: 09/01/2020 CLINICAL DATA:  Respiratory failure.  Reported COVID-19 positive EXAM: CT ANGIOGRAPHY CHEST WITH CONTRAST TECHNIQUE: Multidetector CT imaging of the chest was performed using the standard protocol during bolus administration of intravenous contrast. Multiplanar CT image reconstructions and MIPs were obtained to evaluate the vascular  anatomy. CONTRAST:  OMNIPAQUE IOHEXOL 350 MG/ML SOLN COMPARISON:  Chest radiograph August 31, 2020 FINDINGS: Cardiovascular: There is no demonstrable pulmonary embolus. There is no appreciable thoracic aortic aneurysm. No dissection evident. Note that the contrast bolus in the aorta is less than optimal for dissection assessment. There are multiple foci of great vessel calcification. There is aortic atherosclerosis. There is multifocal coronary artery calcification. There is no pericardial effusion or pericardial thickening. Mediastinum/Nodes: Visualized thyroid appears unremarkable. There is no appreciable thoracic adenopathy. There are no esophageal lesions. Lungs/Pleura: There are small pleural effusions bilaterally. There is compressive atelectasis in each lung base. There  is airspace opacity in the right lower lobe and to a lesser extent scattered in each upper lobe. Mild consolidation is noted in the lateral segment right lower lobe in area of infiltrate. There is diffuse lobular septal thickening throughout the lungs. On axial slice 47 series 6, there is a nodular opacity in the posterior segment of the left upper lobe measuring 7 x 4 mm. On axial slice 42 series 6, there is a 2 mm nodular opacity in the posterior segment of the left upper lobe. Upper Abdomen: There is upper abdominal aortic atherosclerosis. Visualized upper abdominal structures otherwise appear normal. Musculoskeletal: No blastic or lytic bone lesions. No evident chest wall lesions. Review of the MIP images confirms the above findings. IMPRESSION: 1. No appreciable pulmonary embolus. No thoracic aortic aneurysm. Foci of aortic atherosclerosis as well as foci of great vessel and coronary artery calcification noted. 2. Small pleural effusions bilaterally. Apparent compressive atelectasis in the lower lobes. Focal airspace opacity in the lateral aspect of the right middle lobe with consolidation in this area. Patchy airspace opacity is noted in each upper lobe as well. Suspect atypical organism pneumonia. A degree of bacterial superinfection in the right lower lobe is questioned. 3. Nodular opacities, largest measuring 7 x 4 mm in the posterior segment left upper lobe. Non-contrast chest CT at 6-12 months is recommended. If the nodule is stable at time of repeat CT, then future CT at 18-24 months (from today's scan) is considered optional for low-risk patients, but is recommended for high-risk patients. This recommendation follows the consensus statement: Guidelines for Management of Incidental Pulmonary Nodules Detected on CT Images: From the Fleischner Society 2017; Radiology 2017; 284:228-243. 4. Lobular septal thickening bilaterally which may represent a degree of interstitial edema. Atypical interstitial  pneumonitis bilaterally could present in this manner. Lymphangitic spread of tumor from an imaging standpoint is a differential consideration. 5.  No evident adenopathy. Aortic Atherosclerosis (ICD10-I70.0). Electronically Signed   By: Bretta Bang III M.D.   On: 09/01/2020 17:18   DG Chest Port 1 View  Result Date: 08/31/2020 CLINICAL DATA:  COVID-19 EXAM: PORTABLE CHEST 1 VIEW COMPARISON:  None. FINDINGS: Lungs are hyperinflated with diffuse interstitial opacity, greatest at the lung bases. Normal pleural spaces. Normal cardiomediastinal contours. IMPRESSION: COPD with multifocal interstitial opacity that may be chronic or indicative of superimposed viral infection. Electronically Signed   By: Deatra Robinson M.D.   On: 08/31/2020 20:35    Telemetry    Regular sinus rhythm- Personally Reviewed  Cardiac Studies   2D Echocardiogram 9.14.2021  1. Left ventricular ejection fraction, by estimation, is 45 to 50%. The  left ventricle has mildly decreased function. The left ventricle  demonstrates regional wall motion abnormalities (see scoring  diagram/findings for description). Left ventricular  diastolic parameters are consistent with Grade II diastolic dysfunction  (pseudonormalization). Elevated left atrial pressure. There is  severe  hypokinesis of the left ventricular, basal inferoseptal wall and inferior  wall. There is severe hypokinesis of  the left ventricular, basal anteroseptal wall.   2. Right ventricular systolic function is normal. The right ventricular  size is normal.   3. Left atrial size was mildly dilated.   4. The mitral valve is normal in structure. Mild to moderate mitral valve  regurgitation. No evidence of mitral stenosis.   5. Tricuspid valve regurgitation is mild to moderate.   6. The aortic valve has an indeterminant number of cusps. Aortic valve  regurgitation is mild. No aortic stenosis is present.    Patient Profile     64 y.o. female w/ a h/o CAD s/p  LAD stenting in 2010, PAD, chronic pancreatitis, HTN, HL, chronic pain, COPD, and prior tob abuse (quit 6 wks prior to admission), who was admitted 9/13 w/ presumed COVID pna in setting of multiple + family members (s/p mAb @ [facility]), c/p, and dyspnea, and was found to have elev HsTrop and abnl echo w/ EF 45-50% w/ severe basal inferoseptal, inferior, and basal anteroseptal HK.  Assessment & Plan    1.  Non-STEMI/coronary artery disease: Patient with a prior history of CAD status post reported LAD stenting in 2010 who presented to the emergency department on September 13 in the setting of acute worsening of dyspnea and chest pain on the background of recent presumed COVID-19 infection.  High-sensitivity troponin found to be elevated 500  1063  2163  1915.  She was febrile on arrival chest x-ray was consistent with COPD and multifocal interstitial opacities-question superimposed viral infection versus chronic findings.  Echocardiogram September 14 showed an EF of 45 to 50% with severe basal inferoseptal, inferior, and basal anteroseptal hypokinesis.  She has not had recurrent chest pain or dyspnea.  She has been on heparin therapy with consideration being given to diagnostic catheterization.  Unfortunately, H&H have dropped overnight to 7.9 and 23.7 this morning.  In this setting, heparin has been discontinued.  Anemia work-up is pending and plans for catheterization are currently on hold.  Ideally, would like clarity on anemia issue and full recovery from recent respiratory infection (Covid negative this admission) prior to pursuing further cardiac evaluation.  She remains on beta-blocker and statin therapy.  Like heparin, antiplatelets have been held in the setting of acute drop in H&H.  Would have a low threshold to resume aspirin if H&H stable.  2.  Acute hypoxic respiratory failure with presumed COVID-19 pneumonia and AE COPD: Patient recently received monoclonal antibody therapy at [facility] in the setting of  presumed Covid positivity given multiple family contacts who were positive.  She continued to have fevers post-infusion with worsening dyspnea on the day of admission.  Covid PCR here is negative though she remains on airborne precautions as CT chest was concerning for multifocal pneumonia with possible bacterial secondary infection.  Antibiotic therapy per internal medicine.  3.  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  She is euvolemic on examination.  Carvedilol therapy was added yesterday and she remains hypertensive.  Will add losartan therapy.  4.  Peripheral arterial disease: Prior history of PVD.  No active complaints.  Continue statin therapy.  Antiplatelet therapy currently on hold in the setting of anemia.  5.  Hyperlipidemia: Continue statin therapy.  6.  Essential hypertension: Poorly controlled.  Continue beta-blocker therapy with adding ARB in  the setting of LV dysfunction.  7.  History of tobacco abuse: Quit 6 weeks ago.  Encouraged her to remain off of cigarettes.  8.  Macrocytic anemia: H&H down from 9.9/28.6 on the 13th-7.9/23.7 today.  She has not had any bowel movements.  Anemia studies ordered by internal medicine team.  Guaiac stool pending.  Heparin and antiplatelet therapy on hold.  Signed, Nicolasa Ducking, NP  09/02/2020, 8:10 AM    For questions or updates, please contact   Please consult [email or web address] for contact info under Cardiology/STEMI.

## 2020-09-02 NOTE — Plan of Care (Signed)
  Problem: Respiratory: Goal: Will maintain a patent airway Outcome: Progressing   Problem: Clinical Measurements: Goal: Ability to maintain clinical measurements within normal limits will improve Outcome: Progressing Goal: Cardiovascular complication will be avoided Outcome: Progressing   Problem: Safety: Goal: Ability to remain free from injury will improve Outcome: Progressing

## 2020-09-02 NOTE — Progress Notes (Signed)
PROGRESS NOTE    Charlotte Castaneda  XTA:[id number] DOB: 01-27-1956 DOA: 08/31/2020 PCP: Patient, No Pcp Per   Brief Narrative: Taken from H&P Charlotte Castaneda is a 64 y.o. female with medical history significant of coronary artery disease, chronic pain syndrome, essential hypertension, peripheral vascular disease status post left forefoot amputation, previous tobacco abuse quit about 6 weeks ago who apparently was diagnosed with COVID-19 infection on 08/27/2020 at [facility]. Apparently patient traveled from [place] to [place] on August 15, 2020.  Earlier in September her daughter's family got sick with Covid.  Patient also developed fever and some shortness of breath.  They resumed it is Covid and she was never tested but did received monoclonal antibodies 2 days ago.  No Covid test done on admission.  She came to ED with chest pain radiating to left arm and associated with some dyspnea and nausea.  She had elevated inflammatory markers.  Procalcitonin elevated.  She was started on remdesivir, steroid and antibiotics for concern of superadded bacterial infection. Patient is not vaccinated.  Troponin elevated which continue to rise, more than 2000 today, cardiology was consulted for concern of NSTEMI and she was started on IV heparin infusion.  Ordered Covid test yesterday and PCR came back negative.  Discontinue remdesivir and steroid.  Subjective: Patient was feeling better when seen today.  Denies any chest pain or shortness of breath.  Denies any abdominal pain.  No nausea or vomiting.  No melena, stating that she did not had any bowel movement since she came to hospital.  Denied seen blood in her urine. She was feeling hungry and asking for food as cath got canceled.  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 A lot of confusion regarding her Covid 19 status.  No documentation.  PCR done at our facility came back negative.  CTA with some concern of septal thickening and some nodularity which needs follow-up.  Might be some superadded atypical pneumonia.  Saturating well on room air.  Transient hypoxia was most likely secondary to cardiac event. Patient did receive monoclonal antibody at an urgent care, multiple family members with COVID-19 infection. -Discontinue her remdesivir and steroid. -Continue ceftriaxone and Zithromax for a possible bacterial pneumonia.  Procalcitonin was elevated at 0.93. -Continue to monitor.  Elevated troponin/NSTEMI/history of CAD. Patient with history of PAD, CAD s/p PCI.  Per daughter patient had a stent placed in her coronaries in 2008, last year she had a stent placed in her lower abdominal aorta and iliac arteries. Per daughter she did had chest pain radiating to left arm before coming to hospital along with some dyspnea and nausea.  No acute changes on EKG. no chest pain.  Troponin peaked little above 2000, concern of NSTEMI.  Echocardiogram with EF of 40 to 45% along with left wall motion abnormalities. -Consult cardiology-cardiac catheterization was planned for today, but canceled due to acute drop in her hemoglobin from 9.9-7.9 without any obvious bleeding.  Patient was on heparin which was discontinued by cardiology. -Low-dose carvedilol was added by cardiology.  Acute onset anemia.  Hemoglobin dropped to 7.9 this morning without any obvious bleeding.  Patient remains completely asymptomatic.  Might be a lab error.  Her cath was canceled.  Heparin infusion was discontinued. -Repeat CBC. -Anemia panel. -Check FOBT  Hypokalemia.  Resolved.  Magnesium  within normal limit. -Continue to monitor and replete as needed.  Peripheral vascular disease. S/p amputation of left forefoot. No acute concern.  Per daughter she was on Plavix and Lipitor but she did not solve Lipitor in her meds this time.  Patient is from [place], no records available.  She was visiting her daughter here. -Continue Plavix-will ask cardiology if they want to hold before procedure. -Restart Lipitor.  Essential hypertension.  Blood pressure mildly elevated. -Continue to monitor.  Chronic pain syndrome. -Continue home dose of gabapentin.   Objective: Vitals:   09/01/20 1553 09/01/20 2009 09/01/20 2010 09/02/20 0442  BP: (!) 169/73  (!) 149/77 135/70  Pulse: 69  73 73  Resp: 17     Temp: (!) 97.5 F (36.4 C) 97.6 F (36.4 C) 97.6 F (36.4 C) 98.1 F (36.7 C)  TempSrc: Oral Oral Oral Oral  SpO2: 100%  97% 98%  Weight:    41.7 kg  Height:        Intake/Output Summary (Last 24 hours) at 09/02/2020 0741 Last data filed at 09/02/2020 1096 Gross per 24 hour  Intake 318.83 ml  Output 500 ml  Net -181.17 ml   Filed Weights   08/31/20 2018 09/02/20 0442  Weight: 38.6 kg 41.7 kg    Examination:  General.  Frail lady, appears older than her age,in no acute distress. Pulmonary.  Lungs clear bilaterally, normal respiratory effort. CV.  Regular rate and rhythm, no JVD, rub or murmur. Abdomen.  Soft, nontender, nondistended, BS positive. CNS.  Alert and oriented x3.  No focal neurologic deficit. Extremities.  No edema, no cyanosis, pulses intact and symmetrical. Psychiatry.  Judgment and insight appears normal.  DVT prophylaxis: Heparin Code Status: Full  Family Communication: Daughter was updated on phone. Disposition Plan:  Status is: Inpatient  Remains inpatient appropriate because:Inpatient level of care appropriate due to severity of illness   Dispo: The patient is from: Home              Anticipated d/c is to: Home               Anticipated d/c date is: 1-2 days.              Patient currently is not medically stable to d/c.  Consultants:   Cardiology  Procedures:  Antimicrobials:  Ceftriaxone Zithromax  Data Reviewed: I have personally reviewed following labs and imaging studies  CBC: Recent Labs  Lab 08/31/20 2042  WBC 11.7*  NEUTROABS 10.2*  HGB 9.9*  HCT 28.6*  MCV 114.9*  PLT 146*   Basic Metabolic Panel: Recent Labs  Lab 08/31/20 2042 09/02/20 0536  NA 138 138  K 3.3* 4.7  CL 102 104  CO2 23 25  GLUCOSE 135* 125*  BUN 10 23  CREATININE 0.91 0.85  CALCIUM 8.5* 9.0  MG  --  2.0   GFR: Estimated Creatinine Clearance: 43.7 mL/min (by C-G formula based on SCr of 0.85 mg/dL). Liver Function Tests: Recent Labs  Lab 08/31/20 2042 09/02/20 0536  AST 21 35  ALT 15 20  ALKPHOS 101 79  BILITOT 0.6 0.5  PROT 7.8 6.3*  ALBUMIN 3.4* 2.7*   No results for input(s): LIPASE, AMYLASE in the last 168 hours. No results for input(s): AMMONIA in the last 168 hours.  Coagulation Profile: Recent Labs  Lab 09/01/20 0946  INR 1.0   Cardiac Enzymes: No results for input(s): CKTOTAL, CKMB, CKMBINDEX, TROPONINI in the last 168 hours. BNP (last 3 results) No results for input(s): PROBNP in the last 8760 hours. HbA1C: No results for input(s): HGBA1C in the last 72 hours. CBG: No results for input(s): GLUCAP in the last 168 hours. Lipid Profile: Recent Labs    08/31/20 2042  TRIG 152*   Thyroid Function Tests: No results for input(s): TSH, T4TOTAL, FREET4, T3FREE, THYROIDAB in the last 72 hours. Anemia Panel: Recent Labs    08/31/20 2042 09/02/20 0536  FERRITIN 121 105   Sepsis Labs: Recent Labs  Lab 08/31/20 2042 08/31/20 2228  PROCALCITON 0.93  --   LATICACIDVEN 1.5 1.0    Recent Results (from the past 240 hour(s))  Blood Culture (routine x 2)     Status: None (Preliminary result)   Collection Time: 08/31/20  8:42 PM   Specimen: BLOOD  Result Value Ref Range Status    Specimen Description BLOOD RIGHT HAND  Final   Special Requests   Final    BOTTLES DRAWN AEROBIC AND ANAEROBIC Blood Culture adequate volume   Culture   Final    NO GROWTH 2 DAYS Performed at [facility], [address], [place], [place] [address]    Report Status PENDING  Incomplete  Blood Culture (routine x 2)     Status: None (Preliminary result)   Collection Time: 08/31/20  8:42 PM   Specimen: BLOOD  Result Value Ref Range Status   Specimen Description BLOOD LEFT [facility]  Final   Special Requests   Final    BOTTLES DRAWN AEROBIC AND ANAEROBIC Blood Culture adequate volume   Culture   Final    NO GROWTH 2 DAYS Performed at [facility], [address], [place], [place] [address]    Report Status PENDING  Incomplete  SARS Coronavirus 2 by RT PCR (hospital order, performed in [facility] hospital lab) Nasopharyngeal Nasopharyngeal Swab     Status: None   Collection Time: 09/01/20  3:21 PM   Specimen: Nasopharyngeal Swab  Result Value Ref Range Status   SARS Coronavirus 2 NEGATIVE NEGATIVE Final    Comment: (NOTE) SARS-CoV-2 target nucleic acids are NOT DETECTED.  The SARS-CoV-2 RNA is generally detectable in upper and lower respiratory specimens during the acute phase of infection. The lowest concentration of SARS-CoV-2 viral copies this assay can detect is 250 copies / mL. A negative result does not preclude SARS-CoV-2 infection and should not be used as the sole basis for treatment or other patient management decisions.  A negative result may occur with improper specimen collection / handling, submission of specimen other than nasopharyngeal swab, presence of viral mutation(s) within the areas targeted by this assay, and inadequate number of viral copies (<250 copies / mL). A negative result must be combined with clinical observations, patient history, and epidemiological information.  Fact Sheet for Patients:    BoilerBrush.com.cy  Fact Sheet for Healthcare Providers: https://pope.com/  This test is not yet approved or  cleared by the [place] FDA and has been authorized for detection and/or diagnosis of SARS-CoV-2 by FDA under an Emergency Use Authorization (EUA).  This EUA will remain in effect (meaning this test can be used) for the duration of the COVID-19 declaration under Section 564(b)(1) of the Act, 21 U.S.C. section 360bbb-3(b)(1), unless the authorization is terminated or revoked sooner.  Performed at [facility], [address], [place], [place] [address]      Radiology Studies: CT ANGIO CHEST PE W OR WO CONTRAST  Result Date: 09/01/2020 CLINICAL DATA:  Respiratory failure.  Reported COVID-19 positive EXAM: CT ANGIOGRAPHY CHEST WITH CONTRAST TECHNIQUE: Multidetector CT imaging of the chest was performed using the standard protocol during bolus administration of intravenous contrast. Multiplanar CT image reconstructions and MIPs were obtained to evaluate the vascular anatomy. CONTRAST:  OMNIPAQUE IOHEXOL 350 MG/ML SOLN COMPARISON:  Chest radiograph August 31, 2020 FINDINGS: Cardiovascular: There is no demonstrable pulmonary embolus. There is no appreciable thoracic aortic aneurysm. No dissection evident. Note that the contrast bolus in the aorta is less than optimal for dissection assessment. There are multiple foci of great vessel calcification. There is aortic atherosclerosis. There is multifocal coronary artery calcification. There is no pericardial effusion or pericardial thickening. Mediastinum/Nodes: Visualized thyroid appears unremarkable. There is no appreciable thoracic adenopathy. There are no esophageal lesions. Lungs/Pleura: There are small pleural effusions bilaterally. There is compressive atelectasis in each lung base. There is airspace opacity in the right lower lobe and to a lesser extent scattered in  each upper lobe. Mild consolidation is noted in the lateral segment right lower lobe in area of infiltrate. There is diffuse lobular septal thickening throughout the lungs. On axial slice 47 series 6, there is a nodular opacity in the posterior segment of the left upper lobe measuring 7 x 4 mm. On axial slice 42 series 6, there is a 2 mm nodular opacity in the posterior segment of the left upper lobe. Upper Abdomen: There is upper abdominal aortic atherosclerosis. Visualized upper abdominal structures otherwise appear normal. Musculoskeletal: No blastic or lytic bone lesions. No evident chest wall lesions. Review of the MIP images confirms the above findings. IMPRESSION: 1. No appreciable pulmonary embolus. No thoracic aortic aneurysm. Foci of aortic atherosclerosis as well as foci of great vessel and coronary artery calcification noted. 2. Small pleural effusions bilaterally. Apparent compressive atelectasis in the lower lobes. Focal airspace opacity in the lateral aspect of the right middle lobe with consolidation in this area. Patchy airspace opacity is noted in each upper lobe as well. Suspect atypical organism pneumonia. A degree of bacterial superinfection in the right lower lobe is questioned. 3. Nodular opacities, largest measuring 7 x 4 mm in the posterior segment left upper lobe. Non-contrast chest CT at 6-12 months is recommended. If the nodule is stable at time of repeat CT, then future CT at 18-24 months (from today's scan) is considered optional for low-risk patients, but is recommended for high-risk patients. This recommendation follows the consensus statement: Guidelines for Management of Incidental Pulmonary Nodules Detected on CT Images: From the Fleischner Society 2017; Radiology 2017; 284:228-243. 4. Lobular septal thickening bilaterally which may represent a degree of interstitial edema. Atypical interstitial pneumonitis bilaterally could present in this manner. Lymphangitic spread of tumor from  an imaging standpoint is a differential consideration. 5.  No evident adenopathy. Aortic Atherosclerosis (ICD10-I70.0). Electronically Signed   By: Bretta Bang III M.D.   On: 09/01/2020 17:18   DG Chest Port 1 View  Result Date: 08/31/2020 CLINICAL DATA:  COVID-19 EXAM: PORTABLE CHEST 1 VIEW COMPARISON:  None. FINDINGS: Lungs are hyperinflated with diffuse interstitial opacity, greatest at the lung bases. Normal pleural spaces. Normal cardiomediastinal contours. IMPRESSION: COPD with multifocal interstitial opacity that may be chronic or indicative of superimposed viral infection. Electronically Signed   By: Deatra Robinson M.D.   On: 08/31/2020 20:35   ECHOCARDIOGRAM COMPLETE  Result Date: 09/01/2020    ECHOCARDIOGRAM REPORT   Patient Name:   Charlotte Castaneda Date of Exam: 09/01/2020 Medical Rec #:  [id number]     Height:       58.0 in Accession #:    [id number]    Weight:       85.0 lb Date of Birth:  [birth year]     BSA:          1.266 m Patient Age:    63 years      BP:           134/72 mmHg Patient Gender: F             HR:           65 bpm. Exam Location:  [facility] Procedure: 2D Echo, Color Doppler and Cardiac Doppler Indications:     Acute myocardial infarction  History:         Patient has no prior history of Echocardiogram examinations.                  CAD; Risk Factors:Hypertension and Current Smoker. Pt tested                  positive for COVID-19 07/2020.  Sonographer:     Humphrey Rolls RDCS (AE) Referring Phys:  [id number] Rometta Emery Diagnosing Phys: [name] MD  Sonographer Comments: Suboptimal parasternal window. IMPRESSIONS  1. Left ventricular ejection fraction, by estimation, is 45 to 50%. The left ventricle has mildly decreased function. The left ventricle demonstrates regional wall motion abnormalities (see scoring diagram/findings for description). Left ventricular diastolic parameters are consistent with Grade II diastolic dysfunction (pseudonormalization). Elevated left atrial pressure.  There is severe hypokinesis of the left ventricular, basal inferoseptal wall and inferior wall. There is severe hypokinesis of the left ventricular, basal anteroseptal wall.  2. Right ventricular systolic function is normal. The right ventricular size is normal.  3. Left atrial size was mildly dilated.  4. The mitral valve is normal in structure. Mild to moderate mitral valve regurgitation. No evidence of mitral stenosis.  5. Tricuspid valve regurgitation is mild to moderate.  6. The aortic valve has an indeterminant number of cusps. Aortic valve regurgitation is mild. No aortic stenosis is present. FINDINGS  Left Ventricle: Left ventricular ejection fraction, by estimation, is 45 to 50%. The left ventricle has mildly decreased function. The left ventricle demonstrates regional wall motion abnormalities. Severe hypokinesis of the left ventricular, basal inferoseptal wall and inferior wall. Severe hypokinesis of the left ventricular, basal anteroseptal wall. The left ventricular internal cavity size was normal in size. There is borderline left ventricular hypertrophy. Left ventricular diastolic parameters are consistent with Grade II diastolic dysfunction (pseudonormalization). Elevated left atrial pressure. Right Ventricle: The right ventricular size is normal. No increase in right ventricular wall thickness. Right ventricular systolic function is normal. Left Atrium: Left atrial size was mildly dilated. Right Atrium: Right atrial size was normal in size. Pericardium: There is no evidence of pericardial effusion. Mitral Valve: The mitral valve is normal in structure. Mild to moderate mitral valve regurgitation. No evidence of mitral valve stenosis. MV peak gradient, 5.6 mmHg. The mean mitral valve gradient is 2.0 mmHg. Tricuspid Valve: The tricuspid valve is grossly normal. Tricuspid valve regurgitation is mild to moderate. Aortic Valve: The aortic valve has an indeterminant number of cusps. Aortic valve  regurgitation is mild. No aortic stenosis is present. Aortic valve mean gradient measures 3.0 mmHg. Aortic valve peak gradient measures  5.7 mmHg. Aortic valve area, by VTI measures 1.38 cm. Pulmonic Valve: The pulmonic valve was not well visualized. Aorta: The aortic root is normal in size and structure. Pulmonary Artery: The pulmonary artery is not well seen. IAS/Shunts: No atrial level shunt detected by color flow Doppler.  LEFT VENTRICLE PLAX 2D LVIDd:         4.35 cm     Diastology LVIDs:         3.76 cm     LV e' medial:    5.11 cm/s LV PW:         1.01 cm     LV E/e' medial:  19.8 LV IVS:        0.89 cm     LV e' lateral:   7.07 cm/s LVOT diam:     1.70 cm     LV E/e' lateral: 14.3 LV SV:         40 LV SV Index:   32 LVOT Area:     2.27 cm  LV Volumes (MOD) LV vol d, MOD A2C: 89.6 ml LV vol d, MOD A4C: 69.2 ml LV vol s, MOD A2C: 47.1 ml LV vol s, MOD A4C: 32.5 ml LV SV MOD A2C:     42.5 ml LV SV MOD A4C:     69.2 ml LV SV MOD BP:      44.7 ml RIGHT VENTRICLE RV Basal diam:  2.97 cm LEFT ATRIUM             Index       RIGHT ATRIUM          Index LA Vol (A2C):   35.1 ml 27.73 ml/m RA Area:     9.54 cm LA Vol (A4C):   60.7 ml 47.95 ml/m RA Volume:   18.60 ml 14.69 ml/m LA Biplane Vol: 49.4 ml 39.02 ml/m  AORTIC VALVE                   PULMONIC VALVE AV Area (Vmax):    1.41 cm    PV Vmax:       0.79 m/s AV Area (Vmean):   1.54 cm    PV Vmean:      58.800 cm/s AV Area (VTI):     1.38 cm    PV VTI:        0.173 m AV Vmax:           119.00 cm/s PV Peak grad:  2.5 mmHg AV Vmean:          82.900 cm/s PV Mean grad:  2.0 mmHg AV VTI:            0.292 m AV Peak Grad:      5.7 mmHg AV Mean Grad:      3.0 mmHg LVOT Vmax:         73.80 cm/s LVOT Vmean:        56.300 cm/s LVOT VTI:          0.178 m LVOT/AV VTI ratio: 0.61  AORTA Ao Root diam: 2.50 cm MITRAL VALVE MV Area (PHT): 5.79 cm     SHUNTS MV Peak grad:  5.6 mmHg     Systemic VTI:  0.18 m MV Mean grad:  2.0 mmHg     Systemic Diam: 1.70 cm MV Vmax:       1.18  m/s MV Vmean:      59.8 cm/s MV Decel Time: 131 msec MV E velocity: 101.00 cm/s MV  A velocity: 97.70 cm/s MV E/A ratio:  1.03 [name] MD Electronically signed by [name] MD Signature Date/Time: 09/01/2020/12:21:37 PM    Final     Scheduled Meds: . vitamin C  500 mg Oral Daily  . atorvastatin  80 mg Oral Daily  . carvedilol  3.125 mg Oral BID WC  . gabapentin  300 mg Oral TID  . Ipratropium-Albuterol  1 puff Inhalation Q6H  . zinc sulfate  220 mg Oral Daily   Continuous Infusions: . azithromycin    . cefTRIAXone (ROCEPHIN)  IV    . heparin 700 Units/hr (09/02/20 0226)     LOS: 1 day   Time spent: 30 minutes.  Arnetha Courser, MD [facility]  If 7PM-7AM, please contact night-coverage [email or web address]  09/02/2020, 7:41 AM   This record has been created using Conservation officer, historic buildings. Errors have been sought and corrected,but may not always be located. Such creation errors do not reflect on the standard of care.

## 2020-09-03 ENCOUNTER — Other Ambulatory Visit: Payer: Self-pay | Admitting: Nurse Practitioner

## 2020-09-03 DIAGNOSIS — I5020 Unspecified systolic (congestive) heart failure: Secondary | ICD-10-CM

## 2020-09-03 DIAGNOSIS — J9601 Acute respiratory failure with hypoxia: Secondary | ICD-10-CM

## 2020-09-03 DIAGNOSIS — I429 Cardiomyopathy, unspecified: Secondary | ICD-10-CM

## 2020-09-03 DIAGNOSIS — I739 Peripheral vascular disease, unspecified: Secondary | ICD-10-CM

## 2020-09-03 DIAGNOSIS — D509 Iron deficiency anemia, unspecified: Secondary | ICD-10-CM

## 2020-09-03 DIAGNOSIS — I25118 Atherosclerotic heart disease of native coronary artery with other forms of angina pectoris: Secondary | ICD-10-CM

## 2020-09-03 DIAGNOSIS — I420 Dilated cardiomyopathy: Secondary | ICD-10-CM

## 2020-09-03 LAB — CBC WITH DIFFERENTIAL/PLATELET
Abs Immature Granulocytes: 0.04 10*3/uL (ref 0.00–0.07)
Basophils Absolute: 0 10*3/uL (ref 0.0–0.1)
Basophils Relative: 0 %
Eosinophils Absolute: 0 10*3/uL (ref 0.0–0.5)
Eosinophils Relative: 0 %
HCT: 22.4 % — ABNORMAL LOW (ref 36.0–46.0)
Hemoglobin: 7.3 g/dL — ABNORMAL LOW (ref 12.0–15.0)
Immature Granulocytes: 1 %
Lymphocytes Relative: 21 %
Lymphs Abs: 1.3 10*3/uL (ref 0.7–4.0)
MCH: 39.2 pg — ABNORMAL HIGH (ref 26.0–34.0)
MCHC: 32.6 g/dL (ref 30.0–36.0)
MCV: 120.4 fL — ABNORMAL HIGH (ref 80.0–100.0)
Monocytes Absolute: 0.4 10*3/uL (ref 0.1–1.0)
Monocytes Relative: 6 %
Neutro Abs: 4.4 10*3/uL (ref 1.7–7.7)
Neutrophils Relative %: 72 %
Platelets: 126 10*3/uL — ABNORMAL LOW (ref 150–400)
RBC: 1.86 MIL/uL — ABNORMAL LOW (ref 3.87–5.11)
RDW: 13.9 % (ref 11.5–15.5)
Smear Review: NORMAL
WBC: 6.1 10*3/uL (ref 4.0–10.5)
nRBC: 0 % (ref 0.0–0.2)

## 2020-09-03 LAB — COMPREHENSIVE METABOLIC PANEL
ALT: 21 U/L (ref 0–44)
AST: 31 U/L (ref 15–41)
Albumin: 2.5 g/dL — ABNORMAL LOW (ref 3.5–5.0)
Alkaline Phosphatase: 70 U/L (ref 38–126)
Anion gap: 11 (ref 5–15)
BUN: 27 mg/dL — ABNORMAL HIGH (ref 8–23)
CO2: 23 mmol/L (ref 22–32)
Calcium: 8.4 mg/dL — ABNORMAL LOW (ref 8.9–10.3)
Chloride: 104 mmol/L (ref 98–111)
Creatinine, Ser: 1.15 mg/dL — ABNORMAL HIGH (ref 0.44–1.00)
GFR calc Af Amer: 59 mL/min — ABNORMAL LOW (ref >60)
GFR calc non Af Amer: 51 mL/min — ABNORMAL LOW (ref >60)
Glucose, Bld: 109 mg/dL — ABNORMAL HIGH (ref 70–99)
Potassium: 4 mmol/L (ref 3.5–5.1)
Sodium: 138 mmol/L (ref 135–145)
Total Bilirubin: 0.4 mg/dL (ref 0.3–1.2)
Total Protein: 5.5 g/dL — ABNORMAL LOW (ref 6.5–8.1)

## 2020-09-03 LAB — CBC
HCT: 27.7 % — ABNORMAL LOW (ref 36.0–46.0)
Hemoglobin: 9 g/dL — ABNORMAL LOW (ref 12.0–15.0)
MCH: 39.3 pg — ABNORMAL HIGH (ref 26.0–34.0)
MCHC: 32.5 g/dL (ref 30.0–36.0)
MCV: 121 fL — ABNORMAL HIGH (ref 80.0–100.0)
Platelets: 157 10*3/uL (ref 150–400)
RBC: 2.29 MIL/uL — ABNORMAL LOW (ref 3.87–5.11)
RDW: 13.9 % (ref 11.5–15.5)
WBC: 6.3 10*3/uL (ref 4.0–10.5)
nRBC: 0 % (ref 0.0–0.2)

## 2020-09-03 LAB — MAGNESIUM: Magnesium: 1.7 mg/dL (ref 1.7–2.4)

## 2020-09-03 LAB — FERRITIN: Ferritin: 91 ng/mL (ref 11–307)

## 2020-09-03 LAB — C-REACTIVE PROTEIN: CRP: 4.9 mg/dL — ABNORMAL HIGH (ref <1.0)

## 2020-09-03 LAB — FIBRIN DERIVATIVES D-DIMER (ARMC ONLY): Fibrin derivatives D-dimer (ARMC): 2095.12 ng/mL (FEU) — ABNORMAL HIGH (ref 0.00–499.00)

## 2020-09-03 MED ORDER — CLOPIDOGREL BISULFATE 75 MG PO TABS: 75.0000 mg | ORAL_TABLET | Freq: Every day | ORAL | 0 refills | Status: AC

## 2020-09-03 MED ORDER — CLOPIDOGREL BISULFATE 75 MG PO TABS
75.0000 mg | ORAL_TABLET | Freq: Every day | ORAL | Status: DC
  Administered 2020-09-03: 75 mg via ORAL
  Filled 2020-09-03: qty 1

## 2020-09-03 MED ORDER — ASPIRIN 81 MG PO TBEC: 81.0000 mg | DELAYED_RELEASE_TABLET | Freq: Every day | ORAL | 11 refills | Status: AC

## 2020-09-03 MED ORDER — ZINC SULFATE 220 (50 ZN) MG PO CAPS: 220.0000 mg | ORAL_CAPSULE | Freq: Every day | ORAL | 0 refills | Status: AC

## 2020-09-03 MED ORDER — ASCORBIC ACID 500 MG PO TABS: 500.0000 mg | ORAL_TABLET | Freq: Every day | ORAL | 0 refills | Status: AC

## 2020-09-03 MED ORDER — ATORVASTATIN CALCIUM 80 MG PO TABS: 80.0000 mg | ORAL_TABLET | Freq: Every day | ORAL | 0 refills | Status: AC

## 2020-09-03 MED ORDER — LEVOFLOXACIN 750 MG PO TABS: 750.0000 mg | ORAL_TABLET | Freq: Every day | ORAL | 0 refills | Status: AC

## 2020-09-03 MED ORDER — CARVEDILOL 3.125 MG PO TABS: 3.1250 mg | ORAL_TABLET | Freq: Two times a day (BID) | ORAL | 0 refills | Status: AC

## 2020-09-03 MED ORDER — LOSARTAN POTASSIUM 25 MG PO TABS: 12.5000 mg | ORAL_TABLET | Freq: Every day | ORAL | 0 refills | Status: AC

## 2020-09-03 MED ORDER — ASPIRIN EC 81 MG PO TBEC
81.0000 mg | DELAYED_RELEASE_TABLET | Freq: Every day | ORAL | Status: DC
  Administered 2020-09-03: 81 mg via ORAL
  Filled 2020-09-03: qty 1

## 2020-09-03 MED ORDER — GUAIFENESIN-DM 100-10 MG/5ML PO SYRP: 10.0000 mL | ORAL_SOLUTION | ORAL | 0 refills | Status: AC | PRN

## 2020-09-03 MED ORDER — SODIUM CHLORIDE 0.9 % IV SOLN: INTRAVENOUS | Status: DC

## 2020-09-03 NOTE — H&P (View-Only) (Signed)
Progress Note  Patient Name: Charlotte Castaneda Date of Encounter: 09/03/2020  [facility] Cardiologist: Dr. Okey Dupre, [facility]  Subjective   Feels well today, would like to go home and have cardiac catheterization as outpatient. She denies SOB or chest pain, Scant cough BP elevated this Am: "I want to go home!"  Stopped smoking 8 wks ago  Inpatient Medications    Scheduled Meds: . vitamin C  500 mg Oral Daily  . atorvastatin  80 mg Oral Daily  . carvedilol  3.125 mg Oral BID WC  . gabapentin  300 mg Oral TID  . Ipratropium-Albuterol  1 puff Inhalation Q6H  . losartan  12.5 mg Oral Daily  . zinc sulfate  220 mg Oral Daily   Continuous Infusions: . sodium chloride 75 mL/hr at 09/03/20 0838  . azithromycin 500 mg (09/02/20 2307)  . cefTRIAXone (ROCEPHIN)  IV 1 g (09/02/20 2036)   PRN Meds: acetaminophen, ALPRAZolam, chlorpheniramine-HYDROcodone, guaiFENesin-dextromethorphan, ondansetron **OR** ondansetron (ZOFRAN) IV   Vital Signs    Vitals:   09/02/20 1604 09/02/20 2023 09/03/20 0353 09/03/20 0831  BP: (!) 134/57 138/75 139/68 (!) 175/76  Pulse: 70 77 70 77  Resp: 19 18  16   Temp: 97.9 F (36.6 C) 98.2 F (36.8 C) 98.5 F (36.9 C) 98.5 F (36.9 C)  TempSrc: Oral Oral Oral Oral  SpO2: 98% 98% 96% 100%  Weight:      Height:        Intake/Output Summary (Last 24 hours) at 09/03/2020 0929 Last data filed at 09/03/2020 0842 Gross per 24 hour  Intake 250 ml  Output 1700 ml  Net -1450 ml   Last 3 Weights 09/02/2020 08/31/2020  Weight (lbs) 91 lb 14.4 oz 85 lb  Weight (kg) 41.686 kg 38.556 kg      Telemetry    NSR - Personally Reviewed  ECG     - Personally Reviewed  Physical Exam   GEN: No acute distress.  Very thin Neck: No JVD Cardiac: RRR, no murmurs, rubs, or gallops.  Respiratory: Clear to auscultation bilaterally.scattered rales GI: Soft, nontender, non-distended  MS: No edema; No deformity. Neuro:  Nonfocal  Psych: Normal affect   Labs    High  Sensitivity Troponin:   Recent Labs  Lab 08/31/20 2042 08/31/20 2228 09/01/20 0423 09/01/20 1734  TROPONINIHS 500* 1,063* 2,163* 1,915*      Chemistry Recent Labs  Lab 08/31/20 2042 09/02/20 0536 09/03/20 0515  NA 138 138 138  K 3.3* 4.7 4.0  CL 102 104 104  CO2 23 25 23   GLUCOSE 135* 125* 109*  BUN 10 23 27*  CREATININE 0.91 0.85 1.15*  CALCIUM 8.5* 9.0 8.4*  PROT 7.8 6.3* 5.5*  ALBUMIN 3.4* 2.7* 2.5*  AST 21 35 31  ALT 15 20 21   ALKPHOS 101 79 70  BILITOT 0.6 0.5 0.4  GFRNONAA >60 >60 51*  GFRAA >60 >60 59*  ANIONGAP 13 9 11      Hematology Recent Labs  Lab 09/02/20 1353 09/03/20 0515 09/03/20 0821  WBC 9.8 6.1 6.3  RBC 2.38*  2.32* 1.86* 2.29*  HGB 9.4* 7.3* 9.0*  HCT 28.7* 22.4* 27.7*  MCV 120.6* 120.4* 121.0*  MCH 39.5* 39.2* 39.3*  MCHC 32.8 32.6 32.5  RDW 14.2 13.9 13.9  PLT 155 126* 157    BNP Recent Labs  Lab 08/31/20 2042  BNP 1,778.0*     DDimer No results for input(s): DDIMER in the last 168 hours.   Radiology    CT  ANGIO CHEST PE W OR WO CONTRAST  Result Date: 09/01/2020 CLINICAL DATA:  Respiratory failure.  Reported COVID-19 positive EXAM: CT ANGIOGRAPHY CHEST WITH CONTRAST TECHNIQUE: Multidetector CT imaging of the chest was performed using the standard protocol during bolus administration of intravenous contrast. Multiplanar CT image reconstructions and MIPs were obtained to evaluate the vascular anatomy. CONTRAST:  OMNIPAQUE IOHEXOL 350 MG/ML SOLN COMPARISON:  Chest radiograph August 31, 2020 FINDINGS: Cardiovascular: There is no demonstrable pulmonary embolus. There is no appreciable thoracic aortic aneurysm. No dissection evident. Note that the contrast bolus in the aorta is less than optimal for dissection assessment. There are multiple foci of great vessel calcification. There is aortic atherosclerosis. There is multifocal coronary artery calcification. There is no pericardial effusion or pericardial thickening.  Mediastinum/Nodes: Visualized thyroid appears unremarkable. There is no appreciable thoracic adenopathy. There are no esophageal lesions. Lungs/Pleura: There are small pleural effusions bilaterally. There is compressive atelectasis in each lung base. There is airspace opacity in the right lower lobe and to a lesser extent scattered in each upper lobe. Mild consolidation is noted in the lateral segment right lower lobe in area of infiltrate. There is diffuse lobular septal thickening throughout the lungs. On axial slice 47 series 6, there is a nodular opacity in the posterior segment of the left upper lobe measuring 7 x 4 mm. On axial slice 42 series 6, there is a 2 mm nodular opacity in the posterior segment of the left upper lobe. Upper Abdomen: There is upper abdominal aortic atherosclerosis. Visualized upper abdominal structures otherwise appear normal. Musculoskeletal: No blastic or lytic bone lesions. No evident chest wall lesions. Review of the MIP images confirms the above findings. IMPRESSION: 1. No appreciable pulmonary embolus. No thoracic aortic aneurysm. Foci of aortic atherosclerosis as well as foci of great vessel and coronary artery calcification noted. 2. Small pleural effusions bilaterally. Apparent compressive atelectasis in the lower lobes. Focal airspace opacity in the lateral aspect of the right middle lobe with consolidation in this area. Patchy airspace opacity is noted in each upper lobe as well. Suspect atypical organism pneumonia. A degree of bacterial superinfection in the right lower lobe is questioned. 3. Nodular opacities, largest measuring 7 x 4 mm in the posterior segment left upper lobe. Non-contrast chest CT at 6-12 months is recommended. If the nodule is stable at time of repeat CT, then future CT at 18-24 months (from today's scan) is considered optional for low-risk patients, but is recommended for high-risk patients. This recommendation follows the consensus statement:  Guidelines for Management of Incidental Pulmonary Nodules Detected on CT Images: From the Fleischner Society 2017; Radiology 2017; 284:228-243. 4. Lobular septal thickening bilaterally which may represent a degree of interstitial edema. Atypical interstitial pneumonitis bilaterally could present in this manner. Lymphangitic spread of tumor from an imaging standpoint is a differential consideration. 5.  No evident adenopathy. Aortic Atherosclerosis (ICD10-I70.0). Electronically Signed   By: Bretta Bang III M.D.   On: 09/01/2020 17:18   ECHOCARDIOGRAM COMPLETE  Result Date: 09/01/2020    ECHOCARDIOGRAM REPORT   Patient Name:   Charlotte Castaneda Date of Exam: 09/01/2020 Medical Rec #:  [id number]     Height:       58.0 in Accession #:    [id number]    Weight:       85.0 lb Date of Birth:  [birth year]     BSA:          1.266 m Patient Age:    64 years  BP:           134/72 mmHg Patient Gender: F             HR:           65 bpm. Exam Location:  [facility] Procedure: 2D Echo, Color Doppler and Cardiac Doppler Indications:     Acute myocardial infarction  History:         Patient has no prior history of Echocardiogram examinations.                  CAD; Risk Factors:Hypertension and Current Smoker. Pt tested                  positive for COVID-19 07/2020.  Sonographer:     Humphrey Rolls RDCS (AE) Referring Phys:  [id number] Rometta Emery Diagnosing Phys: [name] MD  Sonographer Comments: Suboptimal parasternal window. IMPRESSIONS  1. Left ventricular ejection fraction, by estimation, is 45 to 50%. The left ventricle has mildly decreased function. The left ventricle demonstrates regional wall motion abnormalities (see scoring diagram/findings for description). Left ventricular diastolic parameters are consistent with Grade II diastolic dysfunction (pseudonormalization). Elevated left atrial pressure. There is severe hypokinesis of the left ventricular, basal inferoseptal wall and inferior wall. There is severe  hypokinesis of the left ventricular, basal anteroseptal wall.  2. Right ventricular systolic function is normal. The right ventricular size is normal.  3. Left atrial size was mildly dilated.  4. The mitral valve is normal in structure. Mild to moderate mitral valve regurgitation. No evidence of mitral stenosis.  5. Tricuspid valve regurgitation is mild to moderate.  6. The aortic valve has an indeterminant number of cusps. Aortic valve regurgitation is mild. No aortic stenosis is present. FINDINGS  Left Ventricle: Left ventricular ejection fraction, by estimation, is 45 to 50%. The left ventricle has mildly decreased function. The left ventricle demonstrates regional wall motion abnormalities. Severe hypokinesis of the left ventricular, basal inferoseptal wall and inferior wall. Severe hypokinesis of the left ventricular, basal anteroseptal wall. The left ventricular internal cavity size was normal in size. There is borderline left ventricular hypertrophy. Left ventricular diastolic parameters are consistent with Grade II diastolic dysfunction (pseudonormalization). Elevated left atrial pressure. Right Ventricle: The right ventricular size is normal. No increase in right ventricular wall thickness. Right ventricular systolic function is normal. Left Atrium: Left atrial size was mildly dilated. Right Atrium: Right atrial size was normal in size. Pericardium: There is no evidence of pericardial effusion. Mitral Valve: The mitral valve is normal in structure. Mild to moderate mitral valve regurgitation. No evidence of mitral valve stenosis. MV peak gradient, 5.6 mmHg. The mean mitral valve gradient is 2.0 mmHg. Tricuspid Valve: The tricuspid valve is grossly normal. Tricuspid valve regurgitation is mild to moderate. Aortic Valve: The aortic valve has an indeterminant number of cusps. Aortic valve regurgitation is mild. No aortic stenosis is present. Aortic valve mean gradient measures 3.0 mmHg. Aortic valve peak  gradient measures 5.7 mmHg. Aortic valve area, by VTI measures 1.38 cm. Pulmonic Valve: The pulmonic valve was not well visualized. Aorta: The aortic root is normal in size and structure. Pulmonary Artery: The pulmonary artery is not well seen. IAS/Shunts: No atrial level shunt detected by color flow Doppler.  LEFT VENTRICLE PLAX 2D LVIDd:         4.35 cm     Diastology LVIDs:         3.76 cm     LV e' medial:  5.11 cm/s LV PW:         1.01 cm     LV E/e' medial:  19.8 LV IVS:        0.89 cm     LV e' lateral:   7.07 cm/s LVOT diam:     1.70 cm     LV E/e' lateral: 14.3 LV SV:         40 LV SV Index:   32 LVOT Area:     2.27 cm  LV Volumes (MOD) LV vol d, MOD A2C: 89.6 ml LV vol d, MOD A4C: 69.2 ml LV vol s, MOD A2C: 47.1 ml LV vol s, MOD A4C: 32.5 ml LV SV MOD A2C:     42.5 ml LV SV MOD A4C:     69.2 ml LV SV MOD BP:      44.7 ml RIGHT VENTRICLE RV Basal diam:  2.97 cm LEFT ATRIUM             Index       RIGHT ATRIUM          Index LA Vol (A2C):   35.1 ml 27.73 ml/m RA Area:     9.54 cm LA Vol (A4C):   60.7 ml 47.95 ml/m RA Volume:   18.60 ml 14.69 ml/m LA Biplane Vol: 49.4 ml 39.02 ml/m  AORTIC VALVE                   PULMONIC VALVE AV Area (Vmax):    1.41 cm    PV Vmax:       0.79 m/s AV Area (Vmean):   1.54 cm    PV Vmean:      58.800 cm/s AV Area (VTI):     1.38 cm    PV VTI:        0.173 m AV Vmax:           119.00 cm/s PV Peak grad:  2.5 mmHg AV Vmean:          82.900 cm/s PV Mean grad:  2.0 mmHg AV VTI:            0.292 m AV Peak Grad:      5.7 mmHg AV Mean Grad:      3.0 mmHg LVOT Vmax:         73.80 cm/s LVOT Vmean:        56.300 cm/s LVOT VTI:          0.178 m LVOT/AV VTI ratio: 0.61  AORTA Ao Root diam: 2.50 cm MITRAL VALVE MV Area (PHT): 5.79 cm     SHUNTS MV Peak grad:  5.6 mmHg     Systemic VTI:  0.18 m MV Mean grad:  2.0 mmHg     Systemic Diam: 1.70 cm MV Vmax:       1.18 m/s MV Vmean:      59.8 cm/s MV Decel Time: 131 msec MV E velocity: 101.00 cm/s MV A velocity: 97.70 cm/s MV E/A  ratio:  1.03 Cristal Deer End MD Electronically signed by [name] MD Signature Date/Time: 09/01/2020/12:21:37 PM    Final     Cardiac Studies   Echo as above  Patient Profile     64 year old lady history of COPD, CAD/PCI, hypertension, hyperlipidemia being seen due to chest pain and shortness of breath.  Patient diagnosed with COVID-19 pneumonia.  Troponins were elevated and peaked at 2163.    Assessment & Plan    1. NSTEMI Wall motion abnormality  on echo concerning for underlying CAD Having no symptoms of angina per the patient, She has requested to go home today with outpatient follow up, and consideration fo cardiac cath as outpt --We will arrange outpt f/u with Dr. Okey Dupre who did initial consult on patient, she would likely benefit from radial access cath given pad and LE arterial stents. --would recommend we continue asa 81 and plavix 75 mg daily -she has quit smoking --coreg, lipitior, losartan  Cardiomyopathy Presumed to be ischemic though unable to exclude stress cardiomyopathy in the setting of COVID/URI/PNA On meds as above, outpt cath  HTN In the 130s on meds,  High this AM, anxious, wants to go home today "I need coca cola NOW"  Covid 19 PNA Per the notes tested positive at [facility] and was treated with Ab Sx much improved  Long discussion concerning need for follow up and cath She reports that she will stay withy family until all cardiac workup completed  Total encounter time more than 35 minutes  Greater than 50% was spent in counseling and coordination of care with the patient  Addendum: Discussed with patient's daughter, Asher Muir She is okay with patient going home, patient will remain in town until cardiac catheterization is performed In the a.m. patient going out of town for several days, they would like to do catheterization in [place] radial access next Thursday September 23 if possible -We will check the schedule, discussed with interventional team,  make arrangements. Would likely not need repeat Covid testing as she tested positive on September 5 and will be more than 14 days from positive testing.  Currently no significant symptoms of Covid, afebrile   For questions or updates, please contact [facility] Please consult [email or web address] for contact info under        Signed, Julien Nordmann, MD  09/03/2020, 9:29 AM

## 2020-09-03 NOTE — Discharge Summary (Signed)
Physician Discharge Summary  Charlotte Castaneda IRC:[id number] DOB: 10-18-1956 DOA: 08/31/2020  PCP: Patient, No Pcp Per  Admit date: 08/31/2020 Discharge date: 09/08/2020  Admitted From: Home Disposition:  Home  Recommendations for Outpatient Follow-up:  1. Follow up with PCP in 1-2 weeks 2. Please obtain BMP/CBC in one week 3. Please follow up on the following pending results:None  Home Health:No Equipment/Devices:None Discharge Condition: Stable CODE STATUS: Full Diet recommendation: Heart Healthy / Carb Modified   Brief/Interim Summary: Charlotte Wellsis a 64 y.o.femalewith medical history significant ofcoronary artery disease, chronic pain syndrome, essential hypertension, peripheral vascular disease status post left forefoot amputation, previous tobacco abuse quit about 6 weeks ago who apparently was diagnosed with COVID-19 infection on 08/27/2020 at [facility]. Apparently patient traveled from [place] to [place] on August 15, 2020.  Earlier in September her daughter's family got sick with Covid.  Patient also developed fever and some shortness of breath.  They assumed it is Covid and she was never tested but did received monoclonal antibodies 2 days ago.  No Covid test done on admission.  She came to ED with chest pain radiating to left arm and associated with some dyspnea and nausea.  She had elevated inflammatory markers.  Procalcitonin elevated.  She was started on remdesivir, steroid and antibiotics for concern of superadded bacterial infection. Patient is not vaccinated.  Troponin elevated which continue to rise, peaked at more than 2000, cardiology was consulted for concern of NSTEMI and she was started on IV heparin infusion.  Chest pain resolved and troponin trending down.  Cardiology recommend outpatient cardiac catheterization for further evaluation.  Covid testing done during admission came back negative.  Remdesivir and steroid were discontinued.  She  completed the course of antibiotics for concern of superadded bacterial infection.  Patient was discharged home with outpatient cardiology follow-up for further management of her NSTEMI.  She will go back to her home in [place] once cleared from cardiac standpoint.  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 You are being given antibiotics for 3 more days, please take it as directed. Follow-up with your cardiologist for further recommendations.   Increase activity slowly   Complete by: As directed      Allergies as of 09/03/2020   No Known Allergies     Medication List    TAKE these medications   albuterol 108 (90 Base) MCG/ACT inhaler Commonly known as: VENTOLIN HFA Inhale into the lungs.   ascorbic acid 500 MG tablet Commonly known as: VITAMIN C Take 1 tablet (500 mg total) by mouth daily.   aspirin 81 MG EC tablet Take 1 tablet (81 mg total) by mouth daily. Swallow whole.   atorvastatin 80 MG tablet Commonly known as: LIPITOR Take 1 tablet (80 mg total) by mouth daily.   benzonatate 100 MG capsule Commonly known as: TESSALON Take 1-2 capsules by mouth 3 (  three) times daily as needed. If 1 is not helping you can take 2 at a time. Do not take more than 6 hours in 24 hours.   carvedilol 3.125 MG tablet Commonly known as: COREG Take 1 tablet (3.125 mg total) by mouth 2 (two)  times daily with a meal.   clopidogrel 75 MG tablet Commonly known as: PLAVIX Take 1 tablet (75 mg total) by mouth daily.   gabapentin 300 MG capsule Commonly known as: NEURONTIN Take 300 mg by mouth 3 (three) times daily.   guaiFENesin-dextromethorphan 100-10 MG/5ML syrup Commonly known as: ROBITUSSIN DM Take 10 mLs by mouth every 4 (four) hours as needed for cough.   losartan 25 MG tablet Commonly known as: COZAAR Take 0.5 tablets (12.5 mg total) by mouth daily.   zinc sulfate 220 (50 Zn) MG capsule Take 1 capsule (220 mg total) by mouth daily.     ASK your doctor about these medications   levofloxacin 750 MG tablet Commonly known as: Levaquin Take 1 tablet (750 mg total) by mouth daily for 3 days. Ask about: Should I take this medication?       Follow-up Information    End, Cristal Deer, MD. Go on 09/17/2020.   Specialty: Cardiology Why: @ 3:30 p.m. Contact information: [address] [place] [address] [phone]        [facility] Follow up on 09/10/2020.   Why: Arrive to [facility] at 7am for 9am catheterization. Nothing to eat after midnight except your usual morning medications including aspirin and clopidogrel. Contact information: [address] [place] [address] [phone]       [facility] Follow up on 09/09/2020.   Why: Present to the medical mall at [facility] on the morning of 9/22 for blood count and blood chemistry (required prior to catheterization 9/23).  Orders have already been placed, so you simply need to present to lab. Contact information: [address] [place] [address] [phone]             No Known Allergies  Consultations:  Cardiology  Procedures/Studies: CT ANGIO CHEST PE W OR WO CONTRAST  Result Date: 09/01/2020 CLINICAL DATA:  Respiratory failure.  Reported COVID-19 positive EXAM: CT ANGIOGRAPHY CHEST  WITH CONTRAST TECHNIQUE: Multidetector CT imaging of the chest was performed using the standard protocol during bolus administration of intravenous contrast. Multiplanar CT image reconstructions and MIPs were obtained to evaluate the vascular anatomy. CONTRAST:  OMNIPAQUE IOHEXOL 350 MG/ML SOLN COMPARISON:  Chest radiograph August 31, 2020 FINDINGS: Cardiovascular: There is no demonstrable pulmonary embolus. There is no appreciable thoracic aortic aneurysm. No dissection evident. Note that the contrast bolus in the aorta is less than optimal for dissection assessment. There are multiple foci of great vessel calcification. There is aortic atherosclerosis. There is multifocal coronary artery calcification. There is no pericardial effusion or pericardial thickening. Mediastinum/Nodes: Visualized thyroid appears unremarkable. There is no appreciable thoracic adenopathy. There are no esophageal lesions. Lungs/Pleura: There are small pleural effusions bilaterally. There is compressive atelectasis in each lung base. There is airspace opacity in the right lower lobe and to a lesser extent scattered in each upper lobe. Mild consolidation is noted in the lateral segment right lower lobe in area of infiltrate. There is diffuse lobular septal thickening throughout the lungs. On axial slice 47 series 6, there is a nodular opacity in the posterior segment of the left upper lobe measuring 7 x  4 mm. On axial slice 42 series 6, there is a 2 mm nodular opacity in the posterior segment of the left upper lobe. Upper Abdomen: There is upper abdominal aortic atherosclerosis. Visualized upper abdominal structures otherwise appear normal. Musculoskeletal: No blastic or lytic bone lesions. No evident chest wall lesions. Review of the MIP images confirms the above findings. IMPRESSION: 1. No appreciable pulmonary embolus. No thoracic aortic aneurysm. Foci of aortic atherosclerosis as well as foci of great vessel and coronary artery  calcification noted. 2. Small pleural effusions bilaterally. Apparent compressive atelectasis in the lower lobes. Focal airspace opacity in the lateral aspect of the right middle lobe with consolidation in this area. Patchy airspace opacity is noted in each upper lobe as well. Suspect atypical organism pneumonia. A degree of bacterial superinfection in the right lower lobe is questioned. 3. Nodular opacities, largest measuring 7 x 4 mm in the posterior segment left upper lobe. Non-contrast chest CT at 6-12 months is recommended. If the nodule is stable at time of repeat CT, then future CT at 18-24 months (from today's scan) is considered optional for low-risk patients, but is recommended for high-risk patients. This recommendation follows the consensus statement: Guidelines for Management of Incidental Pulmonary Nodules Detected on CT Images: From the Fleischner Society 2017; Radiology 2017; 284:228-243. 4. Lobular septal thickening bilaterally which may represent a degree of interstitial edema. Atypical interstitial pneumonitis bilaterally could present in this manner. Lymphangitic spread of tumor from an imaging standpoint is a differential consideration. 5.  No evident adenopathy. Aortic Atherosclerosis (ICD10-I70.0). Electronically Signed   By: Bretta Bang III M.D.   On: 09/01/2020 17:18   DG Chest Port 1 View  Result Date: 08/31/2020 CLINICAL DATA:  COVID-19 EXAM: PORTABLE CHEST 1 VIEW COMPARISON:  None. FINDINGS: Lungs are hyperinflated with diffuse interstitial opacity, greatest at the lung bases. Normal pleural spaces. Normal cardiomediastinal contours. IMPRESSION: COPD with multifocal interstitial opacity that may be chronic or indicative of superimposed viral infection. Electronically Signed   By: Deatra Robinson M.D.   On: 08/31/2020 20:35   ECHOCARDIOGRAM COMPLETE  Result Date: 09/01/2020    ECHOCARDIOGRAM REPORT   Patient Name:   Charlotte Castaneda Date of Exam: 09/01/2020 Medical Rec #:   [id number]     Height:       58.0 in Accession #:    [id number]    Weight:       85.0 lb Date of Birth:  July 02, 1956     BSA:          1.266 m Patient Age:    63 years      BP:           134/72 mmHg Patient Gender: F             HR:           65 bpm. Exam Location:  [facility] Procedure: 2D Echo, Color Doppler and Cardiac Doppler Indications:     Acute myocardial infarction  History:         Patient has no prior history of Echocardiogram examinations.                  CAD; Risk Factors:Hypertension and Current Smoker. Pt tested                  positive for COVID-19 07/2020.  Sonographer:     Humphrey Rolls RDCS (AE) Referring Phys:  [id number] Rometta Emery Diagnosing Phys: [name] MD  Sonographer Comments: Suboptimal parasternal window.  IMPRESSIONS  1. Left ventricular ejection fraction, by estimation, is 45 to 50%. The left ventricle has mildly decreased function. The left ventricle demonstrates regional wall motion abnormalities (see scoring diagram/findings for description). Left ventricular diastolic parameters are consistent with Grade II diastolic dysfunction (pseudonormalization). Elevated left atrial pressure. There is severe hypokinesis of the left ventricular, basal inferoseptal wall and inferior wall. There is severe hypokinesis of the left ventricular, basal anteroseptal wall.  2. Right ventricular systolic function is normal. The right ventricular size is normal.  3. Left atrial size was mildly dilated.  4. The mitral valve is normal in structure. Mild to moderate mitral valve regurgitation. No evidence of mitral stenosis.  5. Tricuspid valve regurgitation is mild to moderate.  6. The aortic valve has an indeterminant number of cusps. Aortic valve regurgitation is mild. No aortic stenosis is present. FINDINGS  Left Ventricle: Left ventricular ejection fraction, by estimation, is 45 to 50%. The left ventricle has mildly decreased function. The left ventricle demonstrates regional wall motion abnormalities.  Severe hypokinesis of the left ventricular, basal inferoseptal wall and inferior wall. Severe hypokinesis of the left ventricular, basal anteroseptal wall. The left ventricular internal cavity size was normal in size. There is borderline left ventricular hypertrophy. Left ventricular diastolic parameters are consistent with Grade II diastolic dysfunction (pseudonormalization). Elevated left atrial pressure. Right Ventricle: The right ventricular size is normal. No increase in right ventricular wall thickness. Right ventricular systolic function is normal. Left Atrium: Left atrial size was mildly dilated. Right Atrium: Right atrial size was normal in size. Pericardium: There is no evidence of pericardial effusion. Mitral Valve: The mitral valve is normal in structure. Mild to moderate mitral valve regurgitation. No evidence of mitral valve stenosis. MV peak gradient, 5.6 mmHg. The mean mitral valve gradient is 2.0 mmHg. Tricuspid Valve: The tricuspid valve is grossly normal. Tricuspid valve regurgitation is mild to moderate. Aortic Valve: The aortic valve has an indeterminant number of cusps. Aortic valve regurgitation is mild. No aortic stenosis is present. Aortic valve mean gradient measures 3.0 mmHg. Aortic valve peak gradient measures 5.7 mmHg. Aortic valve area, by VTI measures 1.38 cm. Pulmonic Valve: The pulmonic valve was not well visualized. Aorta: The aortic root is normal in size and structure. Pulmonary Artery: The pulmonary artery is not well seen. IAS/Shunts: No atrial level shunt detected by color flow Doppler.  LEFT VENTRICLE PLAX 2D LVIDd:         4.35 cm     Diastology LVIDs:         3.76 cm     LV e' medial:    5.11 cm/s LV PW:         1.01 cm     LV E/e' medial:  19.8 LV IVS:        0.89 cm     LV e' lateral:   7.07 cm/s LVOT diam:     1.70 cm     LV E/e' lateral: 14.3 LV SV:         40 LV SV Index:   32 LVOT Area:     2.27 cm  LV Volumes (MOD) LV vol d, MOD A2C: 89.6 ml LV vol d, MOD A4C: 69.2  ml LV vol s, MOD A2C: 47.1 ml LV vol s, MOD A4C: 32.5 ml LV SV MOD A2C:     42.5 ml LV SV MOD A4C:     69.2 ml LV SV MOD BP:      44.7 ml RIGHT VENTRICLE RV  Basal diam:  2.97 cm LEFT ATRIUM             Index       RIGHT ATRIUM          Index LA Vol (A2C):   35.1 ml 27.73 ml/m RA Area:     9.54 cm LA Vol (A4C):   60.7 ml 47.95 ml/m RA Volume:   18.60 ml 14.69 ml/m LA Biplane Vol: 49.4 ml 39.02 ml/m  AORTIC VALVE                   PULMONIC VALVE AV Area (Vmax):    1.41 cm    PV Vmax:       0.79 m/s AV Area (Vmean):   1.54 cm    PV Vmean:      58.800 cm/s AV Area (VTI):     1.38 cm    PV VTI:        0.173 m AV Vmax:           119.00 cm/s PV Peak grad:  2.5 mmHg AV Vmean:          82.900 cm/s PV Mean grad:  2.0 mmHg AV VTI:            0.292 m AV Peak Grad:      5.7 mmHg AV Mean Grad:      3.0 mmHg LVOT Vmax:         73.80 cm/s LVOT Vmean:        56.300 cm/s LVOT VTI:          0.178 m LVOT/AV VTI ratio: 0.61  AORTA Ao Root diam: 2.50 cm MITRAL VALVE MV Area (PHT): 5.79 cm     SHUNTS MV Peak grad:  5.6 mmHg     Systemic VTI:  0.18 m MV Mean grad:  2.0 mmHg     Systemic Diam: 1.70 cm MV Vmax:       1.18 m/s MV Vmean:      59.8 cm/s MV Decel Time: 131 msec MV E velocity: 101.00 cm/s MV A velocity: 97.70 cm/s MV E/A ratio:  1.03 Cristal Deer End MD Electronically signed by [name] MD Signature Date/Time: 09/01/2020/12:21:37 PM    Final     Subjective: Patient was seen and examined. No chest pain or SOB. Wants to go home.  Discharge Exam: Vitals:   09/03/20 0831 09/03/20 1117  BP: (!) 175/76 (!) 131/98  Pulse: 77 84  Resp: 16 18  Temp: 98.5 F (36.9 C) 98.3 F (36.8 C)  SpO2: 100% 97%   Vitals:   09/02/20 2023 09/03/20 0353 09/03/20 0831 09/03/20 1117  BP: 138/75 139/68 (!) 175/76 (!) 131/98  Pulse: 77 70 77 84  Resp: 18  16 18   Temp: 98.2 F (36.8 C) 98.5 F (36.9 C) 98.5 F (36.9 C) 98.3 F (36.8 C)  TempSrc: Oral Oral Oral Oral  SpO2: 98% 96% 100% 97%  Weight:      Height:         General: Pt is alert, awake, not in acute distress Cardiovascular: RRR, S1/S2 +, no rubs, no gallops Respiratory: CTA bilaterally, no wheezing, no rhonchi Abdominal: Soft, NT, ND, bowel sounds + Extremities: no edema, no cyanosis   The results of significant diagnostics from this hospitalization (including imaging, microbiology, ancillary and laboratory) are listed below for reference.    Microbiology: Recent Results (from the past 240 hour(s))  Blood Culture (routine x 2)     Status: None  Collection Time: 08/31/20  8:42 PM   Specimen: BLOOD  Result Value Ref Range Status   Specimen Description BLOOD RIGHT HAND  Final   Special Requests   Final    BOTTLES DRAWN AEROBIC AND ANAEROBIC Blood Culture adequate volume   Culture   Final    NO GROWTH 5 DAYS Performed at [facility], [address], [place], [place] [address]    Report Status 09/05/2020 FINAL  Final  Blood Culture (routine x 2)     Status: None   Collection Time: 08/31/20  8:42 PM   Specimen: BLOOD  Result Value Ref Range Status   Specimen Description BLOOD LEFT [facility]  Final   Special Requests   Final    BOTTLES DRAWN AEROBIC AND ANAEROBIC Blood Culture adequate volume   Culture   Final    NO GROWTH 5 DAYS Performed at [facility], [address], [place], [place] [address]    Report Status 09/05/2020 FINAL  Final  SARS Coronavirus 2 by RT PCR (hospital order, performed in [facility] hospital lab) Nasopharyngeal Nasopharyngeal Swab     Status: None   Collection Time: 09/01/20  3:21 PM   Specimen: Nasopharyngeal Swab  Result Value Ref Range Status   SARS Coronavirus 2 NEGATIVE NEGATIVE Final    Comment: (NOTE) SARS-CoV-2 target nucleic acids are NOT DETECTED.  The SARS-CoV-2 RNA is generally detectable in upper and lower respiratory specimens during the acute phase of infection. The lowest concentration of SARS-CoV-2 viral copies this assay can detect is 250 copies / mL. A negative  result does not preclude SARS-CoV-2 infection and should not be used as the sole basis for treatment or other patient management decisions.  A negative result may occur with improper specimen collection / handling, submission of specimen other than nasopharyngeal swab, presence of viral mutation(s) within the areas targeted by this assay, and inadequate number of viral copies (<250 copies / mL). A negative result must be combined with clinical observations, patient history, and epidemiological information.  Fact Sheet for Patients:   BoilerBrush.com.cy  Fact Sheet for Healthcare Providers: https://pope.com/  This test is not yet approved or  cleared by the [place] FDA and has been authorized for detection and/or diagnosis of SARS-CoV-2 by FDA under an Emergency Use Authorization (EUA).  This EUA will remain in effect (meaning this test can be used) for the duration of the COVID-19 declaration under Section 564(b)(1) of the Act, 21 U.S.C. section 360bbb-3(b)(1), unless the authorization is terminated or revoked sooner.  Performed at [facility], [address] [place], [place] [address]   SARS CORONAVIRUS 2 (TAT 6-24 HRS) Nasopharyngeal Nasopharyngeal Swab     Status: None   Collection Time: 09/04/20  2:39 PM   Specimen: Nasopharyngeal Swab  Result Value Ref Range Status   SARS Coronavirus 2 NEGATIVE NEGATIVE Final    Comment: (NOTE) SARS-CoV-2 target nucleic acids are NOT DETECTED.  The SARS-CoV-2 RNA is generally detectable in upper and lower respiratory specimens during the acute phase of infection. Negative results do not preclude SARS-CoV-2 infection, do not rule out co-infections with other pathogens, and should not be used as the sole basis for treatment or other patient management decisions. Negative results must be combined with clinical observations, patient history, and epidemiological information. The  expected result is Negative.  Fact Sheet for Patients: HairSlick.no  Fact Sheet for Healthcare Providers: quierodirigir.com  This test is not yet approved or cleared by the [place] FDA and  has been authorized for detection and/or diagnosis of SARS-CoV-2 by FDA under an Emergency Use Authorization (EUA). This EUA will remain  in effect (meaning this test can be used) for the duration of the COVID-19 declaration under Se ction 564(b)(1) of the Act, 21 U.S.C. section 360bbb-3(b)(1), unless the authorization is terminated or revoked sooner.  Performed at [facility], [address], [place], [place] [address]      Labs: BNP (last 3 results) Recent Labs    08/31/20 2042  BNP 1,778.0*   Basic Metabolic Panel: Recent Labs  Lab 09/02/20 0536 09/03/20 0515  NA 138 138  K 4.7 4.0  CL 104 104  CO2 25 23  GLUCOSE 125* 109*  BUN 23 27*  CREATININE 0.85 1.15*  CALCIUM 9.0 8.4*  MG 2.0 1.7   Liver Function Tests: Recent Labs  Lab 09/02/20 0536 09/03/20 0515  AST 35 31  ALT 20 21  ALKPHOS 79 70  BILITOT 0.5 0.4  PROT 6.3* 5.5*  ALBUMIN 2.7* 2.5*   No results for input(s): LIPASE, AMYLASE in the last 168 hours. No results for input(s): AMMONIA in the last 168 hours. CBC: Recent Labs  Lab 09/02/20 0536 09/02/20 1353 09/03/20 0515 09/03/20 0821  WBC 8.3 9.8 6.1 6.3  NEUTROABS 7.0  --  4.4  --   HGB 7.9* 9.4* 7.3* 9.0*  HCT 23.7* 28.7* 22.4* 27.7*  MCV 119.7* 120.6* 120.4* 121.0*  PLT 139* 155 126* 157   Cardiac Enzymes: No results for input(s): CKTOTAL, CKMB, CKMBINDEX, TROPONINI in the last 168 hours. BNP: Invalid input(s): POCBNP CBG: No results for input(s): GLUCAP in the last 168 hours. D-Dimer No results for input(s): DDIMER in the last 72 hours. Hgb A1c No results for input(s): HGBA1C in the last 72 hours. Lipid Profile No results for input(s): CHOL, HDL, LDLCALC, TRIG, CHOLHDL,  LDLDIRECT in the last 72 hours. Thyroid function studies No results for input(s): TSH, T4TOTAL, T3FREE, THYROIDAB in the last 72 hours.  Invalid input(s): FREET3 Anemia work up No results for input(s): VITAMINB12, FOLATE, FERRITIN, TIBC, IRON, RETICCTPCT in the last 72 hours. Urinalysis    Component Value Date/Time   COLORURINE STRAW (A) 08/31/2020 2042   APPEARANCEUR CLEAR (A) 08/31/2020 2042   LABSPEC 1.005 08/31/2020 2042   PHURINE 7.0 08/31/2020 2042   GLUCOSEU NEGATIVE 08/31/2020 2042   HGBUR NEGATIVE 08/31/2020 2042   BILIRUBINUR NEGATIVE 08/31/2020 2042   KETONESUR NEGATIVE 08/31/2020 2042   PROTEINUR NEGATIVE 08/31/2020 2042   NITRITE NEGATIVE 08/31/2020 2042   LEUKOCYTESUR NEGATIVE 08/31/2020 2042   Sepsis Labs Invalid input(s): PROCALCITONIN,  WBC,  LACTICIDVEN Microbiology Recent Results (from the past 240 hour(s))  Blood Culture (routine x 2)     Status: None   Collection Time: 08/31/20  8:42 PM   Specimen: BLOOD  Result Value Ref Range Status   Specimen Description BLOOD RIGHT HAND  Final   Special Requests   Final    BOTTLES DRAWN AEROBIC AND ANAEROBIC Blood Culture adequate volume   Culture   Final    NO GROWTH 5 DAYS Performed at [facility], [address] [place], [place] [address]    Report Status 09/05/2020 FINAL  Final  Blood Culture (routine x 2)     Status: None   Collection Time: 08/31/20  8:42 PM   Specimen: BLOOD  Result Value Ref Range Status   Specimen Description BLOOD LEFT [facility]  Final   Special Requests   Final    BOTTLES DRAWN  AEROBIC AND ANAEROBIC Blood Culture adequate volume   Culture   Final    NO GROWTH 5 DAYS Performed at [facility], [address] [place], [place] [address]    Report Status 09/05/2020 FINAL  Final  SARS Coronavirus 2 by RT PCR (hospital order, performed in [facility] hospital lab) Nasopharyngeal Nasopharyngeal Swab     Status: None   Collection Time: 09/01/20  3:21 PM   Specimen:  Nasopharyngeal Swab  Result Value Ref Range Status   SARS Coronavirus 2 NEGATIVE NEGATIVE Final    Comment: (NOTE) SARS-CoV-2 target nucleic acids are NOT DETECTED.  The SARS-CoV-2 RNA is generally detectable in upper and lower respiratory specimens during the acute phase of infection. The lowest concentration of SARS-CoV-2 viral copies this assay can detect is 250 copies / mL. A negative result does not preclude SARS-CoV-2 infection and should not be used as the sole basis for treatment or other patient management decisions.  A negative result may occur with improper specimen collection / handling, submission of specimen other than nasopharyngeal swab, presence of viral mutation(s) within the areas targeted by this assay, and inadequate number of viral copies (<250 copies / mL). A negative result must be combined with clinical observations, patient history, and epidemiological information.  Fact Sheet for Patients:   BoilerBrush.com.cy  Fact Sheet for Healthcare Providers: https://pope.com/  This test is not yet approved or  cleared by the [place] FDA and has been authorized for detection and/or diagnosis of SARS-CoV-2 by FDA under an Emergency Use Authorization (EUA).  This EUA will remain in effect (meaning this test can be used) for the duration of the COVID-19 declaration under Section 564(b)(1) of the Act, 21 U.S.C. section 360bbb-3(b)(1), unless the authorization is terminated or revoked sooner.  Performed at [facility], [address] [place], [place] [address]   SARS CORONAVIRUS 2 (TAT 6-24 HRS) Nasopharyngeal Nasopharyngeal Swab     Status: None   Collection Time: 09/04/20  2:39 PM   Specimen: Nasopharyngeal Swab  Result Value Ref Range Status   SARS Coronavirus 2 NEGATIVE NEGATIVE Final    Comment: (NOTE) SARS-CoV-2 target nucleic acids are NOT DETECTED.  The SARS-CoV-2 RNA is generally detectable  in upper and lower respiratory specimens during the acute phase of infection. Negative results do not preclude SARS-CoV-2 infection, do not rule out co-infections with other pathogens, and should not be used as the sole basis for treatment or other patient management decisions. Negative results must be combined with clinical observations, patient history, and epidemiological information. The expected result is Negative.  Fact Sheet for Patients: HairSlick.no  Fact Sheet for Healthcare Providers: quierodirigir.com  This test is not yet approved or cleared by the [place] FDA and  has been authorized for detection and/or diagnosis of SARS-CoV-2 by FDA under an Emergency Use Authorization (EUA). This EUA will remain  in effect (meaning this test can be used) for the duration of the COVID-19 declaration under Se ction 564(b)(1) of the Act, 21 U.S.C. section 360bbb-3(b)(1), unless the authorization is terminated or revoked sooner.  Performed at [facility], [address] [place], [place] [address]     Time coordinating discharge: Over 30 minutes  SIGNED:  Arnetha Courser, MD  [facility] 09/08/2020, 12:25 PM  If 7PM-7AM, please contact night-coverage [email or web address]  This record has been created using Conservation officer, historic buildings. Errors have been sought and corrected,but may not always be located. Such creation errors do not reflect on the  standard of care.

## 2020-09-03 NOTE — Progress Notes (Addendum)
Progress Note  Patient Name: Charlotte Castaneda Date of Encounter: 09/03/2020  [facility] Cardiologist: Dr. Okey Dupre, [facility]  Subjective   Feels well today, would like to go home and have cardiac catheterization as outpatient. She denies SOB or chest pain, Scant cough BP elevated this Am: "I want to go home!"  Stopped smoking 8 wks ago  Inpatient Medications    Scheduled Meds: . vitamin C  500 mg Oral Daily  . atorvastatin  80 mg Oral Daily  . carvedilol  3.125 mg Oral BID WC  . gabapentin  300 mg Oral TID  . Ipratropium-Albuterol  1 puff Inhalation Q6H  . losartan  12.5 mg Oral Daily  . zinc sulfate  220 mg Oral Daily   Continuous Infusions: . sodium chloride 75 mL/hr at 09/03/20 0838  . azithromycin 500 mg (09/02/20 2307)  . cefTRIAXone (ROCEPHIN)  IV 1 g (09/02/20 2036)   PRN Meds: acetaminophen, ALPRAZolam, chlorpheniramine-HYDROcodone, guaiFENesin-dextromethorphan, ondansetron **OR** ondansetron (ZOFRAN) IV   Vital Signs    Vitals:   09/02/20 1604 09/02/20 2023 09/03/20 0353 09/03/20 0831  BP: (!) 134/57 138/75 139/68 (!) 175/76  Pulse: 70 77 70 77  Resp: 19 18  16   Temp: 97.9 F (36.6 C) 98.2 F (36.8 C) 98.5 F (36.9 C) 98.5 F (36.9 C)  TempSrc: Oral Oral Oral Oral  SpO2: 98% 98% 96% 100%  Weight:      Height:        Intake/Output Summary (Last 24 hours) at 09/03/2020 0929 Last data filed at 09/03/2020 0842 Gross per 24 hour  Intake 250 ml  Output 1700 ml  Net -1450 ml   Last 3 Weights 09/02/2020 08/31/2020  Weight (lbs) 91 lb 14.4 oz 85 lb  Weight (kg) 41.686 kg 38.556 kg      Telemetry    NSR - Personally Reviewed  ECG     - Personally Reviewed  Physical Exam   GEN: No acute distress.  Very thin Neck: No JVD Cardiac: RRR, no murmurs, rubs, or gallops.  Respiratory: Clear to auscultation bilaterally.scattered rales GI: Soft, nontender, non-distended  MS: No edema; No deformity. Neuro:  Nonfocal  Psych: Normal affect   Labs    High  Sensitivity Troponin:   Recent Labs  Lab 08/31/20 2042 08/31/20 2228 09/01/20 0423 09/01/20 1734  TROPONINIHS 500* 1,063* 2,163* 1,915*      Chemistry Recent Labs  Lab 08/31/20 2042 09/02/20 0536 09/03/20 0515  NA 138 138 138  K 3.3* 4.7 4.0  CL 102 104 104  CO2 23 25 23   GLUCOSE 135* 125* 109*  BUN 10 23 27*  CREATININE 0.91 0.85 1.15*  CALCIUM 8.5* 9.0 8.4*  PROT 7.8 6.3* 5.5*  ALBUMIN 3.4* 2.7* 2.5*  AST 21 35 31  ALT 15 20 21   ALKPHOS 101 79 70  BILITOT 0.6 0.5 0.4  GFRNONAA >60 >60 51*  GFRAA >60 >60 59*  ANIONGAP 13 9 11      Hematology Recent Labs  Lab 09/02/20 1353 09/03/20 0515 09/03/20 0821  WBC 9.8 6.1 6.3  RBC 2.38*  2.32* 1.86* 2.29*  HGB 9.4* 7.3* 9.0*  HCT 28.7* 22.4* 27.7*  MCV 120.6* 120.4* 121.0*  MCH 39.5* 39.2* 39.3*  MCHC 32.8 32.6 32.5  RDW 14.2 13.9 13.9  PLT 155 126* 157    BNP Recent Labs  Lab 08/31/20 2042  BNP 1,778.0*     DDimer No results for input(s): DDIMER in the last 168 hours.   Radiology    CT  ANGIO CHEST PE W OR WO CONTRAST  Result Date: 09/01/2020 CLINICAL DATA:  Respiratory failure.  Reported COVID-19 positive EXAM: CT ANGIOGRAPHY CHEST WITH CONTRAST TECHNIQUE: Multidetector CT imaging of the chest was performed using the standard protocol during bolus administration of intravenous contrast. Multiplanar CT image reconstructions and MIPs were obtained to evaluate the vascular anatomy. CONTRAST:  OMNIPAQUE IOHEXOL 350 MG/ML SOLN COMPARISON:  Chest radiograph August 31, 2020 FINDINGS: Cardiovascular: There is no demonstrable pulmonary embolus. There is no appreciable thoracic aortic aneurysm. No dissection evident. Note that the contrast bolus in the aorta is less than optimal for dissection assessment. There are multiple foci of great vessel calcification. There is aortic atherosclerosis. There is multifocal coronary artery calcification. There is no pericardial effusion or pericardial thickening.  Mediastinum/Nodes: Visualized thyroid appears unremarkable. There is no appreciable thoracic adenopathy. There are no esophageal lesions. Lungs/Pleura: There are small pleural effusions bilaterally. There is compressive atelectasis in each lung base. There is airspace opacity in the right lower lobe and to a lesser extent scattered in each upper lobe. Mild consolidation is noted in the lateral segment right lower lobe in area of infiltrate. There is diffuse lobular septal thickening throughout the lungs. On axial slice 47 series 6, there is a nodular opacity in the posterior segment of the left upper lobe measuring 7 x 4 mm. On axial slice 42 series 6, there is a 2 mm nodular opacity in the posterior segment of the left upper lobe. Upper Abdomen: There is upper abdominal aortic atherosclerosis. Visualized upper abdominal structures otherwise appear normal. Musculoskeletal: No blastic or lytic bone lesions. No evident chest wall lesions. Review of the MIP images confirms the above findings. IMPRESSION: 1. No appreciable pulmonary embolus. No thoracic aortic aneurysm. Foci of aortic atherosclerosis as well as foci of great vessel and coronary artery calcification noted. 2. Small pleural effusions bilaterally. Apparent compressive atelectasis in the lower lobes. Focal airspace opacity in the lateral aspect of the right middle lobe with consolidation in this area. Patchy airspace opacity is noted in each upper lobe as well. Suspect atypical organism pneumonia. A degree of bacterial superinfection in the right lower lobe is questioned. 3. Nodular opacities, largest measuring 7 x 4 mm in the posterior segment left upper lobe. Non-contrast chest CT at 6-12 months is recommended. If the nodule is stable at time of repeat CT, then future CT at 18-24 months (from today's scan) is considered optional for low-risk patients, but is recommended for high-risk patients. This recommendation follows the consensus statement:  Guidelines for Management of Incidental Pulmonary Nodules Detected on CT Images: From the Fleischner Society 2017; Radiology 2017; 284:228-243. 4. Lobular septal thickening bilaterally which may represent a degree of interstitial edema. Atypical interstitial pneumonitis bilaterally could present in this manner. Lymphangitic spread of tumor from an imaging standpoint is a differential consideration. 5.  No evident adenopathy. Aortic Atherosclerosis (ICD10-I70.0). Electronically Signed   By: Bretta Bang III M.D.   On: 09/01/2020 17:18   ECHOCARDIOGRAM COMPLETE  Result Date: 09/01/2020    ECHOCARDIOGRAM REPORT   Patient Name:   Charlotte Castaneda Date of Exam: 09/01/2020 Medical Rec #:  [id number]     Height:       58.0 in Accession #:    [id number]    Weight:       85.0 lb Date of Birth:  [birth year]     BSA:          1.266 m Patient Age:    64 years  BP:           134/72 mmHg Patient Gender: F             HR:           65 bpm. Exam Location:  [facility] Procedure: 2D Echo, Color Doppler and Cardiac Doppler Indications:     Acute myocardial infarction  History:         Patient has no prior history of Echocardiogram examinations.                  CAD; Risk Factors:Hypertension and Current Smoker. Pt tested                  positive for COVID-19 07/2020.  Sonographer:     Humphrey Rolls RDCS (AE) Referring Phys:  [id number] Rometta Emery Diagnosing Phys: [name] MD  Sonographer Comments: Suboptimal parasternal window. IMPRESSIONS  1. Left ventricular ejection fraction, by estimation, is 45 to 50%. The left ventricle has mildly decreased function. The left ventricle demonstrates regional wall motion abnormalities (see scoring diagram/findings for description). Left ventricular diastolic parameters are consistent with Grade II diastolic dysfunction (pseudonormalization). Elevated left atrial pressure. There is severe hypokinesis of the left ventricular, basal inferoseptal wall and inferior wall. There is severe  hypokinesis of the left ventricular, basal anteroseptal wall.  2. Right ventricular systolic function is normal. The right ventricular size is normal.  3. Left atrial size was mildly dilated.  4. The mitral valve is normal in structure. Mild to moderate mitral valve regurgitation. No evidence of mitral stenosis.  5. Tricuspid valve regurgitation is mild to moderate.  6. The aortic valve has an indeterminant number of cusps. Aortic valve regurgitation is mild. No aortic stenosis is present. FINDINGS  Left Ventricle: Left ventricular ejection fraction, by estimation, is 45 to 50%. The left ventricle has mildly decreased function. The left ventricle demonstrates regional wall motion abnormalities. Severe hypokinesis of the left ventricular, basal inferoseptal wall and inferior wall. Severe hypokinesis of the left ventricular, basal anteroseptal wall. The left ventricular internal cavity size was normal in size. There is borderline left ventricular hypertrophy. Left ventricular diastolic parameters are consistent with Grade II diastolic dysfunction (pseudonormalization). Elevated left atrial pressure. Right Ventricle: The right ventricular size is normal. No increase in right ventricular wall thickness. Right ventricular systolic function is normal. Left Atrium: Left atrial size was mildly dilated. Right Atrium: Right atrial size was normal in size. Pericardium: There is no evidence of pericardial effusion. Mitral Valve: The mitral valve is normal in structure. Mild to moderate mitral valve regurgitation. No evidence of mitral valve stenosis. MV peak gradient, 5.6 mmHg. The mean mitral valve gradient is 2.0 mmHg. Tricuspid Valve: The tricuspid valve is grossly normal. Tricuspid valve regurgitation is mild to moderate. Aortic Valve: The aortic valve has an indeterminant number of cusps. Aortic valve regurgitation is mild. No aortic stenosis is present. Aortic valve mean gradient measures 3.0 mmHg. Aortic valve peak  gradient measures 5.7 mmHg. Aortic valve area, by VTI measures 1.38 cm. Pulmonic Valve: The pulmonic valve was not well visualized. Aorta: The aortic root is normal in size and structure. Pulmonary Artery: The pulmonary artery is not well seen. IAS/Shunts: No atrial level shunt detected by color flow Doppler.  LEFT VENTRICLE PLAX 2D LVIDd:         4.35 cm     Diastology LVIDs:         3.76 cm     LV e' medial:  5.11 cm/s LV PW:         1.01 cm     LV E/e' medial:  19.8 LV IVS:        0.89 cm     LV e' lateral:   7.07 cm/s LVOT diam:     1.70 cm     LV E/e' lateral: 14.3 LV SV:         40 LV SV Index:   32 LVOT Area:     2.27 cm  LV Volumes (MOD) LV vol d, MOD A2C: 89.6 ml LV vol d, MOD A4C: 69.2 ml LV vol s, MOD A2C: 47.1 ml LV vol s, MOD A4C: 32.5 ml LV SV MOD A2C:     42.5 ml LV SV MOD A4C:     69.2 ml LV SV MOD BP:      44.7 ml RIGHT VENTRICLE RV Basal diam:  2.97 cm LEFT ATRIUM             Index       RIGHT ATRIUM          Index LA Vol (A2C):   35.1 ml 27.73 ml/m RA Area:     9.54 cm LA Vol (A4C):   60.7 ml 47.95 ml/m RA Volume:   18.60 ml 14.69 ml/m LA Biplane Vol: 49.4 ml 39.02 ml/m  AORTIC VALVE                   PULMONIC VALVE AV Area (Vmax):    1.41 cm    PV Vmax:       0.79 m/s AV Area (Vmean):   1.54 cm    PV Vmean:      58.800 cm/s AV Area (VTI):     1.38 cm    PV VTI:        0.173 m AV Vmax:           119.00 cm/s PV Peak grad:  2.5 mmHg AV Vmean:          82.900 cm/s PV Mean grad:  2.0 mmHg AV VTI:            0.292 m AV Peak Grad:      5.7 mmHg AV Mean Grad:      3.0 mmHg LVOT Vmax:         73.80 cm/s LVOT Vmean:        56.300 cm/s LVOT VTI:          0.178 m LVOT/AV VTI ratio: 0.61  AORTA Ao Root diam: 2.50 cm MITRAL VALVE MV Area (PHT): 5.79 cm     SHUNTS MV Peak grad:  5.6 mmHg     Systemic VTI:  0.18 m MV Mean grad:  2.0 mmHg     Systemic Diam: 1.70 cm MV Vmax:       1.18 m/s MV Vmean:      59.8 cm/s MV Decel Time: 131 msec MV E velocity: 101.00 cm/s MV A velocity: 97.70 cm/s MV E/A  ratio:  1.03 Christopher End MD Electronically signed by Christopher End MD Signature Date/Time: 09/01/2020/12:21:37 PM    Final     Cardiac Studies   Echo as above  Patient Profile     63-year-old lady history of COPD, CAD/PCI, hypertension, hyperlipidemia being seen due to chest pain and shortness of breath.  Patient diagnosed with COVID-19 pneumonia.  Troponins were elevated and peaked at 2163.    Assessment & Plan    1. NSTEMI Wall motion abnormality   on echo concerning for underlying CAD Having no symptoms of angina per the patient, She has requested to go home today with outpatient follow up, and consideration fo cardiac cath as outpt --We will arrange outpt f/u with Dr. Okey Dupre who did initial consult on patient, she would likely benefit from radial access cath given pad and LE arterial stents. --would recommend we continue asa 81 and plavix 75 mg daily -she has quit smoking --coreg, lipitior, losartan  Cardiomyopathy Presumed to be ischemic though unable to exclude stress cardiomyopathy in the setting of COVID/URI/PNA On meds as above, outpt cath  HTN In the 130s on meds,  High this AM, anxious, wants to go home today "I need coca cola NOW"  Covid 19 PNA Per the notes tested positive at [facility] and was treated with Ab Sx much improved  Long discussion concerning need for follow up and cath She reports that she will stay withy family until all cardiac workup completed  Total encounter time more than 35 minutes  Greater than 50% was spent in counseling and coordination of care with the patient  Addendum: Discussed with patient's daughter, Asher Muir She is okay with patient going home, patient will remain in town until cardiac catheterization is performed In the a.m. patient going out of town for several days, they would like to do catheterization in [place] radial access next Thursday September 23 if possible -We will check the schedule, discussed with interventional team,  make arrangements. Would likely not need repeat Covid testing as she tested positive on September 5 and will be more than 14 days from positive testing.  Currently no significant symptoms of Covid, afebrile   For questions or updates, please contact [facility] Please consult [email or web address] for contact info under        Signed, Julien Nordmann, MD  09/03/2020, 9:29 AM

## 2020-09-04 ENCOUNTER — Other Ambulatory Visit
Admission: RE | Admit: 2020-09-04 | Discharge: 2020-09-04 | Disposition: A | Discharge status: Home / Self Care | Payer: Medicare Other | Source: Ambulatory Visit | Attending: Internal Medicine | Admitting: Internal Medicine

## 2020-09-04 ENCOUNTER — Other Ambulatory Visit: Payer: Self-pay

## 2020-09-04 ENCOUNTER — Telehealth: Payer: Self-pay | Admitting: Internal Medicine

## 2020-09-04 ENCOUNTER — Other Ambulatory Visit: Payer: Self-pay | Admitting: Nurse Practitioner

## 2020-09-04 DIAGNOSIS — Z01810 Encounter for preprocedural cardiovascular examination: Secondary | ICD-10-CM

## 2020-09-04 DIAGNOSIS — Z20822 Contact with and (suspected) exposure to covid-19: Secondary | ICD-10-CM | POA: Insufficient documentation

## 2020-09-04 DIAGNOSIS — Z01812 Encounter for preprocedural laboratory examination: Secondary | ICD-10-CM | POA: Diagnosis present

## 2020-09-04 DIAGNOSIS — I5020 Unspecified systolic (congestive) heart failure: Secondary | ICD-10-CM

## 2020-09-04 NOTE — Telephone Encounter (Signed)
Patient daughter calling back in hopes to move up cath to Monday  Please advise

## 2020-09-04 NOTE — Telephone Encounter (Signed)
Orders have been placed for 9/21 cath.  Instructions (NPO, arrival time for short stay @ [facility]) were included in d/c paperwork.  I see you updated info re: arrival time for [facility].    Thank you for addressing fever w/ pts dtr.  Ongoing fever/resp illness is just another reason why we did not feel cath was appropriate during her hospital stay.

## 2020-09-04 NOTE — Telephone Encounter (Signed)
Patient daughter wants asap cath.  Please call to discuss moving to Monday

## 2020-09-04 NOTE — Telephone Encounter (Signed)
Discussed cath schedule options and Covid testing with Charlotte Givens, NP prior to calling patient. Not able to do on Monday but could do Tuesday at [facility] with Dr End. Will need STAT labs morning of procedure (CBC, BMET). Will need COVID test this afternoon because unable to find documented positive in CareEverywhere.    Spoke to daughter. She is agreeable to Cath on 09/08/20 at 9:30 am at [facility]. She will get Covid test this afternoon for patient. Patient will arrive on 09/08/20 at 7:30 am so that she can get STAT labs prior to cath procedure. She was appreciative.  She is dealing with frustration that patient is from [place] state and visiting them here. 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 She is very understanding but feeling the stress of this situation and worrying about COVID and her mom's health. She is concerned that patient was discharged and should have had cath while hospitalized.  Daughter was told patient was afebrile on discharge but since yesterday evening her temperature has been running 102.2 degrees.  Discussed with Charlotte Givens, NP who advised for tylenol and if continue fever then they should seek urgent care since patient does not have PCP here.  Daughter verbalized understanding.

## 2020-09-05 LAB — CULTURE, BLOOD (ROUTINE X 2)
Culture: NO GROWTH
Culture: NO GROWTH
Special Requests: ADEQUATE
Special Requests: ADEQUATE

## 2020-09-05 LAB — SARS CORONAVIRUS 2 (TAT 6-24 HRS): SARS Coronavirus 2: NEGATIVE

## 2020-09-06 ENCOUNTER — Encounter: Payer: Self-pay | Admitting: Family

## 2020-09-07 ENCOUNTER — Encounter: Payer: Self-pay | Admitting: Family

## 2020-09-08 ENCOUNTER — Encounter: Payer: Self-pay | Admitting: Internal Medicine

## 2020-09-08 ENCOUNTER — Other Ambulatory Visit: Payer: Self-pay

## 2020-09-08 ENCOUNTER — Ambulatory Visit
Admission: RE | Admit: 2020-09-08 | Discharge: 2020-09-08 | Disposition: A | Discharge status: Home / Self Care | Payer: Medicare Other | Attending: Internal Medicine | Admitting: Internal Medicine

## 2020-09-08 ENCOUNTER — Encounter
Admission: RE | Disposition: A | Discharge status: Home / Self Care | Payer: Self-pay | Source: Home / Self Care | Attending: Internal Medicine

## 2020-09-08 ENCOUNTER — Telehealth: Payer: Self-pay

## 2020-09-08 DIAGNOSIS — I2510 Atherosclerotic heart disease of native coronary artery without angina pectoris: Secondary | ICD-10-CM | POA: Diagnosis not present

## 2020-09-08 DIAGNOSIS — J440 Chronic obstructive pulmonary disease with acute lower respiratory infection: Secondary | ICD-10-CM | POA: Insufficient documentation

## 2020-09-08 DIAGNOSIS — I428 Other cardiomyopathies: Secondary | ICD-10-CM

## 2020-09-08 DIAGNOSIS — I10 Essential (primary) hypertension: Secondary | ICD-10-CM | POA: Insufficient documentation

## 2020-09-08 DIAGNOSIS — E785 Hyperlipidemia, unspecified: Secondary | ICD-10-CM | POA: Diagnosis not present

## 2020-09-08 DIAGNOSIS — I429 Cardiomyopathy, unspecified: Secondary | ICD-10-CM | POA: Insufficient documentation

## 2020-09-08 DIAGNOSIS — I2129 ST elevation (STEMI) myocardial infarction involving other sites: Secondary | ICD-10-CM

## 2020-09-08 DIAGNOSIS — Z87891 Personal history of nicotine dependence: Secondary | ICD-10-CM | POA: Diagnosis not present

## 2020-09-08 DIAGNOSIS — Z8616 Personal history of COVID-19: Secondary | ICD-10-CM | POA: Insufficient documentation

## 2020-09-08 DIAGNOSIS — I25118 Atherosclerotic heart disease of native coronary artery with other forms of angina pectoris: Secondary | ICD-10-CM

## 2020-09-08 DIAGNOSIS — I214 Non-ST elevation (NSTEMI) myocardial infarction: Secondary | ICD-10-CM

## 2020-09-08 DIAGNOSIS — Z79899 Other long term (current) drug therapy: Secondary | ICD-10-CM | POA: Insufficient documentation

## 2020-09-08 HISTORY — PX: LEFT HEART CATH AND CORONARY ANGIOGRAPHY: CATH118249

## 2020-09-08 SURGERY — LEFT HEART CATH AND CORONARY ANGIOGRAPHY
Anesthesia: Moderate Sedation | Laterality: Left

## 2020-09-08 MED ORDER — NITROGLYCERIN 0.4 MG SL SUBL: 0.4000 mg | SUBLINGUAL_TABLET | SUBLINGUAL | 99 refills | Status: AC | PRN

## 2020-09-08 MED ORDER — ONDANSETRON HCL 4 MG/2ML IJ SOLN: 4.0000 mg | Freq: Four times a day (QID) | INTRAMUSCULAR | Status: DC | PRN

## 2020-09-08 MED ORDER — ACETAMINOPHEN 325 MG PO TABS: 650.0000 mg | ORAL_TABLET | ORAL | Status: DC | PRN

## 2020-09-08 MED ORDER — HEPARIN (PORCINE) IN NACL 1000-0.9 UT/500ML-% IV SOLN
INTRAVENOUS | Status: DC | PRN
  Administered 2020-09-08: 500 mL

## 2020-09-08 MED ORDER — SODIUM CHLORIDE 0.9% FLUSH: 3.0000 mL | INTRAVENOUS | Status: DC | PRN

## 2020-09-08 MED ORDER — IOHEXOL 300 MG/ML  SOLN
INTRAMUSCULAR | Status: DC | PRN
  Administered 2020-09-08: 60 mL

## 2020-09-08 MED ORDER — SODIUM CHLORIDE 0.9 % IV SOLN: INTRAVENOUS | Status: DC

## 2020-09-08 MED ORDER — MIDAZOLAM HCL 2 MG/2ML IJ SOLN
INTRAMUSCULAR | Status: AC
  Filled 2020-09-08: qty 2

## 2020-09-08 MED ORDER — HEPARIN (PORCINE) IN NACL 1000-0.9 UT/500ML-% IV SOLN
INTRAVENOUS | Status: AC
  Filled 2020-09-08: qty 1000

## 2020-09-08 MED ORDER — SODIUM CHLORIDE 0.9 % IV SOLN: 250.0000 mL | INTRAVENOUS | Status: DC | PRN

## 2020-09-08 MED ORDER — VERAPAMIL HCL 2.5 MG/ML IV SOLN
INTRAVENOUS | Status: AC
  Filled 2020-09-08: qty 2

## 2020-09-08 MED ORDER — FENTANYL CITRATE (PF) 100 MCG/2ML IJ SOLN
INTRAMUSCULAR | Status: DC | PRN
Start: 2020-09-08 — End: 2020-09-08
  Administered 2020-09-08 (×2): 12.5 ug via INTRAVENOUS

## 2020-09-08 MED ORDER — ISOSORBIDE MONONITRATE ER 30 MG PO TB24: 15.0000 mg | ORAL_TABLET | Freq: Every day | ORAL | 0 refills | Status: DC

## 2020-09-08 MED ORDER — ASPIRIN 81 MG PO CHEW
CHEWABLE_TABLET | ORAL | Status: AC
  Filled 2020-09-08: qty 1

## 2020-09-08 MED ORDER — FENTANYL CITRATE (PF) 100 MCG/2ML IJ SOLN
INTRAMUSCULAR | Status: AC
  Filled 2020-09-08: qty 2

## 2020-09-08 MED ORDER — SODIUM CHLORIDE 0.9% FLUSH: 3.0000 mL | Freq: Two times a day (BID) | INTRAVENOUS | Status: DC

## 2020-09-08 MED ORDER — LABETALOL HCL 5 MG/ML IV SOLN: 10.0000 mg | INTRAVENOUS | Status: DC | PRN

## 2020-09-08 MED ORDER — ASPIRIN 81 MG PO CHEW
81.0000 mg | CHEWABLE_TABLET | ORAL | Status: AC
  Administered 2020-09-08: 81 mg via ORAL

## 2020-09-08 MED ORDER — VERAPAMIL HCL 2.5 MG/ML IV SOLN
INTRAVENOUS | Status: DC | PRN
  Administered 2020-09-08: 2.5 mg via INTRA_ARTERIAL

## 2020-09-08 MED ORDER — HYDRALAZINE HCL 20 MG/ML IJ SOLN: 10.0000 mg | INTRAMUSCULAR | Status: DC | PRN

## 2020-09-08 MED ORDER — MIDAZOLAM HCL 2 MG/2ML IJ SOLN
INTRAMUSCULAR | Status: DC | PRN
  Administered 2020-09-08 (×2): 0.5 mg via INTRAVENOUS

## 2020-09-08 MED ORDER — ISOSORBIDE MONONITRATE ER 30 MG PO TB24: 15.0000 mg | ORAL_TABLET | Freq: Every day | ORAL | 1 refills | Status: AC

## 2020-09-08 MED ORDER — LIDOCAINE HCL (PF) 1 % IJ SOLN
INTRAMUSCULAR | Status: AC
  Filled 2020-09-08: qty 30

## 2020-09-08 MED ORDER — HEPARIN SODIUM (PORCINE) 1000 UNIT/ML IJ SOLN
INTRAMUSCULAR | Status: DC | PRN
  Administered 2020-09-08: 2500 [IU] via INTRAVENOUS

## 2020-09-08 MED ORDER — HEPARIN SODIUM (PORCINE) 1000 UNIT/ML IJ SOLN
INTRAMUSCULAR | Status: AC
  Filled 2020-09-08: qty 1

## 2020-09-08 SURGICAL SUPPLY — 10 items
CATH 5F 110X4 TIG (CATHETERS) ×2 IMPLANT
CATH INFINITI 5 FR JL3.5 (CATHETERS) ×2 IMPLANT
CATH INFINITI JR4 5F (CATHETERS) ×2 IMPLANT
DEVICE RAD TR BAND REGULAR (VASCULAR PRODUCTS) ×2 IMPLANT
GUIDEWIRE INQWIRE 1.5J.035X260 (WIRE) ×1 IMPLANT
INQWIRE 1.5J .035X260CM (WIRE) ×2
KIT MANI 3VAL PERCEP (MISCELLANEOUS) ×2 IMPLANT
PACK CARDIAC CATH (CUSTOM PROCEDURE TRAY) ×2 IMPLANT
SHEATH GLIDE SLENDER 4/5FR (SHEATH) ×2 IMPLANT
WIRE HITORQ VERSACORE ST 145CM (WIRE) ×2 IMPLANT

## 2020-09-08 NOTE — Telephone Encounter (Signed)
Appt moved up to 09/11/20 @ 10am with Gillian Shields, NP.  Referral placed and signed off by Eula Listen, PA.  RD will update the patient and her daughter regarding the appt and referral.

## 2020-09-08 NOTE — Interval H&P Note (Signed)
History and Physical Interval Note:  09/08/2020 9:17 AM  Charlotte Castaneda  has presented today for surgery, with the diagnosis of NSTEMI.  The various methods of treatment have been discussed with the patient and family. After consideration of risks, benefits and other options for treatment, the patient has consented to  Procedure(s): LEFT HEART CATH AND CORONARY ANGIOGRAPHY (Left) as a surgical intervention.  The patient's history has been reviewed, patient examined, no change in status, stable for surgery.  I have reviewed the patient's chart and labs.  Questions were answered to the patient's satisfaction.    Cath Lab Visit (complete for each Cath Lab visit)  Clinical Evaluation Leading to the Procedure:   ACS: Yes.    Non-ACS:  N/A  Thelbert Gartin

## 2020-09-08 NOTE — Telephone Encounter (Signed)
-----   Message from [name], MD sent at 09/08/2020 11:28 AM EDT ----- Regarding: Post-cath f/u and orders Hello,  Could you help schedule Ms. Charlotte Castaneda for a post-cath f/u this Friday (she is scheduled to fly home to [facility] this weekend).  Also, can we place a referral (will need to be printed for the patient to take with her when she leaves specials/recovery today) for a cardiologist at [facility] in [place], [place]?  Thanks.  Chris End

## 2020-09-08 NOTE — Progress Notes (Signed)
Dr. Okey Dupre at bedside, speaking with pt. Re: cath results. MD also spoke twice with pt. Daughter Larina Earthly . Both verbalized understanding of conversation. MD gave pt. 2 RX to take with her. Pt. To see MD at his office this Friday (9/24) and discuss if she is stable enough to travel 9/25.

## 2020-09-08 NOTE — Brief Op Note (Signed)
BRIEF CARDIAC CATHETERIZATION NOTE  09/08/2020  11:26 AM  PATIENT:  Charlotte Castaneda  64 y.o. female  PRE-OPERATIVE DIAGNOSIS:  NSTEMI  POST-OPERATIVE DIAGNOSIS:  NSTEMI  PROCEDURE:  Procedure(s): LEFT HEART CATH AND CORONARY ANGIOGRAPHY (Left)  SURGEON:  Surgeon(s) and Role:    * [name], MD - Primary  FINDINGS: 1. Multivessel CAD, including diffuse LMCA disease of up to 40% with moderate calcification, mild diffuse LAD disease with patent mid vessel stent, 70% mid LCx stenosis, and chronic total occlusion at the ostium of RCA. 2. Normal LVEDP.  RECOMMENDATIONS: 1. Continue aggressive medical therapy with ongoing workup of chronic anemia and persistent fevers in the setting of COVID-19 infection and possible superimposed PNA. 2. Continue ASA and clopidogrel, as tolerated. 3. Add isosorbide mononitrate. 4. F/u in office later this week or early next week.  If no further angina, travel back to [facility] could be considered.  [name], MD [facility]

## 2020-09-09 ENCOUNTER — Other Ambulatory Visit: Payer: Medicare Other

## 2020-09-09 ENCOUNTER — Encounter: Payer: Self-pay | Admitting: Internal Medicine

## 2020-09-10 ENCOUNTER — Ambulatory Visit (HOSPITAL_COMMUNITY): Admit: 2020-09-10 | Payer: Medicare Other | Admitting: Internal Medicine

## 2020-09-10 ENCOUNTER — Ambulatory Visit: Payer: Medicare Other | Admitting: Family

## 2020-09-10 ENCOUNTER — Encounter (HOSPITAL_COMMUNITY): Payer: Self-pay

## 2020-09-10 SURGERY — LEFT HEART CATH AND CORONARY ANGIOGRAPHY
Anesthesia: LOCAL

## 2020-09-11 ENCOUNTER — Ambulatory Visit: Payer: Medicare Other | Admitting: Family

## 2020-09-11 NOTE — Progress Notes (Deleted)
Office Visit    Patient Name: Charlotte Castaneda Date of Encounter: 09/11/2020  Primary Care Provider:  Patient, No Pcp Per Primary Cardiologist:  No primary care provider on file. Electrophysiologist:  None   Chief Complaint    Charlotte Castaneda is a 64 y.o. female with a hx of *** presents today for ***   Past Medical History    Past Medical History:  Diagnosis Date  . CAD (coronary artery disease)   . Chronic pancreatitis (HCC)   . COPD (chronic obstructive pulmonary disease) (HCC)   . HTN (hypertension)   . MI (myocardial infarction) (HCC)   . PAD (peripheral artery disease) (HCC)    Past Surgical History:  Procedure Laterality Date  . AMPUTATION TOE    . LEFT HEART CATH AND CORONARY ANGIOGRAPHY Left 09/21/2020   Procedure: LEFT HEART CATH AND CORONARY ANGIOGRAPHY;  Surgeon: [name], MD;  Location: [facility]IVE [facility];  Service: Cardiovascular;  Laterality: Left;    Allergies  No Known Allergies  History of Present Illness    Charlotte Castaneda is a 64 y.o. female with a hx of *** last seen ***.  EKGs/Labs/Other Studies Reviewed:   The following studies were reviewed today: [facility] September 21, 2020 Conclusions: 1. Multivessel coronary artery disease including diffuse LMCA plaquing with focal mid to distal stenosis of 40 to 50%, mild to moderate LAD/diagonal disease of up to 40%, calcified proximal and mid LCx with sequential 50 and 70% lesions plaquing OM1, and chronic total occlusion of ostial RCA with left-to-right collaterals. 2. Patent mid LAD stent with minimal in-stent restenosis. 3. Normal left ventricular filling pressure.   Recommendations: 1. Continue aggressive medical therapy with ongoing workup of chronic anemia and persistent fevers in the setting of COVID-19 infection and possible superimposed bacterial pneumonia. 2. Continue aspirin and clopidogrel, as tolerated. 3. Add isosorbide mononitrate. 4. Follow-up in office later this week or early next week.  If no  further angina, travel back to [place] could be considered. 5. If the patient has recurrent fevers or develops dyspnea/cough, further evaluation for possible pneumonia to be performed through urgent care/ED.   Echo 09/01/20  1. Left ventricular ejection fraction, by estimation, is 45 to 50%. The  left ventricle has mildly decreased function. The left ventricle  demonstrates regional wall motion abnormalities (see scoring  diagram/findings for description). Left ventricular  diastolic parameters are consistent with Grade II diastolic dysfunction  (pseudonormalization). Elevated left atrial pressure. There is severe  hypokinesis of the left ventricular, basal inferoseptal wall and inferior  wall. There is severe hypokinesis of  the left ventricular, basal anteroseptal wall.   2. Right ventricular systolic function is normal. The right ventricular  size is normal.   3. Left atrial size was mildly dilated.   4. The mitral valve is normal in structure. Mild to moderate mitral valve  regurgitation. No evidence of mitral stenosis.   5. Tricuspid valve regurgitation is mild to moderate.   6. The aortic valve has an indeterminant number of cusps. Aortic valve  regurgitation is mild. No aortic stenosis is present.   EKG:  EKG is  ordered today.  The ekg ordered today demonstrates ***  Recent Labs: 08/31/2020: B Natriuretic Peptide 1,778.0 09/03/2020: ALT 21; BUN 27; Creatinine, Ser 1.15; Hemoglobin 9.0; Magnesium 1.7; Platelets 157; Potassium 4.0; Sodium 138  Recent Lipid Panel    Component Value Date/Time   TRIG 152 (H) 08/31/2020 2042    Home Medications   No outpatient medications have been marked  as taking for the 09/11/20 encounter (Appointment) with Alver Sorrow, NP.      Review of Systems    ***   ROS All other systems reviewed and are otherwise negative except as noted above.  Physical Exam    VS:  There were no vitals taken for this visit. , BMI There is no  height or weight on file to calculate BMI. GEN: Well nourished, well developed, in no acute distress. HEENT: normal. Neck: Supple, no JVD, carotid bruits, or masses. Cardiac: ***RRR, no murmurs, rubs, or gallops. No clubbing, cyanosis, edema.  ***Radials/DP/PT 2+ and equal bilaterally.  Respiratory:  ***Respirations regular and unlabored, clear to auscultation bilaterally. GI: Soft, nontender, nondistended, BS + x 4. MS: No deformity or atrophy. Skin: Warm and dry, no rash. Neuro:  Strength and sensation are intact. Psych: Normal affect.  Accessory Clinical Findings    ECG personally reviewed by me today - *** - no acute changes.  Assessment & Plan    1. ***  Disposition: Follow up {follow up:15908} with ***   Alver Sorrow, NP 09/11/2020, 8:08 AM

## 2020-09-14 ENCOUNTER — Encounter: Payer: Self-pay | Admitting: Family

## 2020-09-15 ENCOUNTER — Telehealth: Payer: Self-pay | Admitting: *Deleted

## 2020-09-15 NOTE — Telephone Encounter (Signed)
Called patient's daughter back. Gave her the information I received and asked her to call me back if needed. She was very Adult nurse.

## 2020-09-15 NOTE — Telephone Encounter (Signed)
Charlotte Castaneda received incoming call from patient's daughter. Daughter was worried because patient is back in [place] state and has appointment with new cardiologist there today. The office is saying the referral says "vascular" and patient will not be able to be seen unless the referral says cardiology. I looked back at my referral which I faxed successfully this morning at 0818.  It says "Ambulatory referral to cardiology" Please establish care with [facility]; diagnosis:CAD.  I offered to call [facility] at [phone] to see if they can still have patient seen today and make sure they received the fax from this morning.   Was on hold with [facility] office for 20 minutes until getting to the referral person. In all I was on the phone about 30 minutes. Alvino Chapel, one of the schedulers, was very kind.  She was able to find a referral specialist named Judeth Cornfield who was able to figure out what was going on. Sounds like the referral was entered incorrectly. They've asked me to fax again to [phone], marked as URGENT ATTN Stephanie. This has been done. Hopefully, they will still be able to the patient later today. I stressed that this appointment was pertinent to continuing care of patient.

## 2020-09-17 ENCOUNTER — Ambulatory Visit: Payer: Medicare Other | Admitting: Family

## 2024-01-20 DEATH — deceased
